# Patient Record
Sex: Female | Born: 1980 | Race: White | Hispanic: No | Smoking: Former smoker
Health system: Southern US, Community
[De-identification: ages and names within clinical notes are randomized; demographics above are authoritative.]

## PROBLEM LIST (undated history)

## (undated) DIAGNOSIS — Z803 Family history of malignant neoplasm of breast: Secondary | ICD-10-CM

## (undated) DIAGNOSIS — F419 Anxiety disorder, unspecified: Secondary | ICD-10-CM

## (undated) DIAGNOSIS — Z9889 Other specified postprocedural states: Secondary | ICD-10-CM

## (undated) DIAGNOSIS — Z8041 Family history of malignant neoplasm of ovary: Secondary | ICD-10-CM

## (undated) DIAGNOSIS — C801 Malignant (primary) neoplasm, unspecified: Secondary | ICD-10-CM

## (undated) DIAGNOSIS — T8859XA Other complications of anesthesia, initial encounter: Secondary | ICD-10-CM

## (undated) DIAGNOSIS — R112 Nausea with vomiting, unspecified: Secondary | ICD-10-CM

## (undated) DIAGNOSIS — S0300XA Dislocation of jaw, unspecified side, initial encounter: Secondary | ICD-10-CM

## (undated) DIAGNOSIS — K259 Gastric ulcer, unspecified as acute or chronic, without hemorrhage or perforation: Secondary | ICD-10-CM

## (undated) HISTORY — PX: FINGER SURGERY: SHX640

## (undated) HISTORY — PX: OTHER SURGICAL HISTORY: SHX169

## (undated) HISTORY — DX: Family history of malignant neoplasm of ovary: Z80.41

## (undated) HISTORY — DX: Dislocation of jaw, unspecified side, initial encounter: S03.00XA

## (undated) HISTORY — PX: FRACTURE SURGERY: SHX138

## (undated) HISTORY — PX: DILATION AND CURETTAGE OF UTERUS: SHX78

## (undated) HISTORY — DX: Family history of malignant neoplasm of breast: Z80.3

---

## 1999-04-27 ENCOUNTER — Ambulatory Visit (HOSPITAL_COMMUNITY): Admission: RE | Admit: 1999-04-27 | Discharge: 1999-04-27 | Payer: Self-pay | Admitting: Obstetrics & Gynecology

## 1999-04-27 ENCOUNTER — Encounter (INDEPENDENT_AMBULATORY_CARE_PROVIDER_SITE_OTHER): Payer: Self-pay

## 1999-08-03 ENCOUNTER — Ambulatory Visit (HOSPITAL_COMMUNITY): Admission: RE | Admit: 1999-08-03 | Discharge: 1999-08-03 | Payer: Self-pay | Admitting: Gastroenterology

## 1999-08-31 ENCOUNTER — Encounter: Admission: RE | Admit: 1999-08-31 | Discharge: 1999-08-31 | Payer: Self-pay | Admitting: Internal Medicine

## 1999-08-31 ENCOUNTER — Encounter: Payer: Self-pay | Admitting: Internal Medicine

## 1999-10-12 ENCOUNTER — Encounter: Admission: RE | Admit: 1999-10-12 | Discharge: 1999-11-30 | Payer: Self-pay | Admitting: Internal Medicine

## 2000-01-16 ENCOUNTER — Other Ambulatory Visit: Admission: RE | Admit: 2000-01-16 | Discharge: 2000-01-16 | Payer: Self-pay | Admitting: Obstetrics & Gynecology

## 2000-02-02 ENCOUNTER — Encounter: Payer: Self-pay | Admitting: Obstetrics & Gynecology

## 2000-02-02 ENCOUNTER — Ambulatory Visit (HOSPITAL_COMMUNITY): Admission: AD | Admit: 2000-02-02 | Discharge: 2000-02-02 | Payer: Self-pay | Admitting: Obstetrics and Gynecology

## 2000-03-04 ENCOUNTER — Encounter: Payer: Self-pay | Admitting: Obstetrics and Gynecology

## 2000-03-04 ENCOUNTER — Ambulatory Visit (HOSPITAL_COMMUNITY): Admission: RE | Admit: 2000-03-04 | Discharge: 2000-03-04 | Payer: Self-pay

## 2000-08-05 ENCOUNTER — Inpatient Hospital Stay (HOSPITAL_COMMUNITY): Admission: AD | Admit: 2000-08-05 | Discharge: 2000-08-05 | Payer: Self-pay | Admitting: Obstetrics & Gynecology

## 2000-08-06 ENCOUNTER — Inpatient Hospital Stay (HOSPITAL_COMMUNITY): Admission: AD | Admit: 2000-08-06 | Discharge: 2000-08-09 | Payer: Self-pay | Admitting: Obstetrics & Gynecology

## 2000-08-10 ENCOUNTER — Encounter: Admission: RE | Admit: 2000-08-10 | Discharge: 2000-09-25 | Payer: Self-pay | Admitting: Obstetrics & Gynecology

## 2001-12-16 ENCOUNTER — Other Ambulatory Visit: Admission: RE | Admit: 2001-12-16 | Discharge: 2001-12-16 | Payer: Self-pay | Admitting: Obstetrics and Gynecology

## 2002-03-26 ENCOUNTER — Inpatient Hospital Stay (HOSPITAL_COMMUNITY): Admission: AD | Admit: 2002-03-26 | Discharge: 2002-03-26 | Payer: Self-pay | Admitting: Obstetrics and Gynecology

## 2002-03-26 ENCOUNTER — Encounter: Payer: Self-pay | Admitting: Obstetrics and Gynecology

## 2002-05-28 ENCOUNTER — Inpatient Hospital Stay (HOSPITAL_COMMUNITY): Admission: AD | Admit: 2002-05-28 | Discharge: 2002-05-28 | Payer: Self-pay | Admitting: Obstetrics & Gynecology

## 2002-06-10 ENCOUNTER — Inpatient Hospital Stay (HOSPITAL_COMMUNITY): Admission: AD | Admit: 2002-06-10 | Discharge: 2002-06-10 | Payer: Self-pay | Admitting: Obstetrics and Gynecology

## 2002-06-12 ENCOUNTER — Inpatient Hospital Stay (HOSPITAL_COMMUNITY): Admission: AD | Admit: 2002-06-12 | Discharge: 2002-06-12 | Payer: Self-pay | Admitting: Obstetrics and Gynecology

## 2002-06-14 ENCOUNTER — Emergency Department (HOSPITAL_COMMUNITY): Admission: EM | Admit: 2002-06-14 | Discharge: 2002-06-14 | Payer: Self-pay | Admitting: Emergency Medicine

## 2002-06-24 ENCOUNTER — Ambulatory Visit (HOSPITAL_COMMUNITY): Admission: RE | Admit: 2002-06-24 | Discharge: 2002-06-24 | Payer: Self-pay

## 2002-07-10 ENCOUNTER — Encounter (HOSPITAL_COMMUNITY): Admission: RE | Admit: 2002-07-10 | Discharge: 2002-08-08 | Payer: Self-pay

## 2002-07-26 ENCOUNTER — Encounter: Payer: Self-pay | Admitting: Obstetrics and Gynecology

## 2002-07-26 ENCOUNTER — Inpatient Hospital Stay (HOSPITAL_COMMUNITY): Admission: AD | Admit: 2002-07-26 | Discharge: 2002-07-26 | Payer: Self-pay

## 2002-08-08 ENCOUNTER — Inpatient Hospital Stay (HOSPITAL_COMMUNITY): Admission: AD | Admit: 2002-08-08 | Discharge: 2002-08-10 | Payer: Self-pay

## 2002-10-08 ENCOUNTER — Emergency Department (HOSPITAL_COMMUNITY): Admission: EM | Admit: 2002-10-08 | Discharge: 2002-10-08 | Payer: Self-pay | Admitting: Emergency Medicine

## 2002-10-27 ENCOUNTER — Encounter: Admission: RE | Admit: 2002-10-27 | Discharge: 2003-01-15 | Payer: Self-pay | Admitting: Internal Medicine

## 2003-01-09 ENCOUNTER — Emergency Department (HOSPITAL_COMMUNITY): Admission: EM | Admit: 2003-01-09 | Discharge: 2003-01-09 | Payer: Self-pay | Admitting: Emergency Medicine

## 2003-04-01 ENCOUNTER — Encounter: Payer: Self-pay | Admitting: Emergency Medicine

## 2003-04-01 ENCOUNTER — Emergency Department (HOSPITAL_COMMUNITY): Admission: EM | Admit: 2003-04-01 | Discharge: 2003-04-01 | Payer: Self-pay | Admitting: Emergency Medicine

## 2003-10-04 ENCOUNTER — Encounter: Admission: RE | Admit: 2003-10-04 | Discharge: 2003-10-04 | Payer: Self-pay | Admitting: Internal Medicine

## 2003-10-06 ENCOUNTER — Encounter: Admission: RE | Admit: 2003-10-06 | Discharge: 2004-01-04 | Payer: Self-pay | Admitting: *Deleted

## 2004-05-25 ENCOUNTER — Ambulatory Visit: Payer: Self-pay | Admitting: Obstetrics & Gynecology

## 2004-07-19 ENCOUNTER — Observation Stay: Payer: Self-pay | Admitting: Unknown Physician Specialty

## 2004-07-25 ENCOUNTER — Observation Stay: Payer: Self-pay

## 2004-08-10 ENCOUNTER — Observation Stay: Payer: Self-pay

## 2004-08-25 ENCOUNTER — Inpatient Hospital Stay: Payer: Self-pay | Admitting: Obstetrics & Gynecology

## 2004-10-12 ENCOUNTER — Ambulatory Visit: Payer: Self-pay | Admitting: Obstetrics & Gynecology

## 2004-11-10 ENCOUNTER — Emergency Department: Payer: Self-pay | Admitting: Emergency Medicine

## 2005-09-24 ENCOUNTER — Ambulatory Visit: Payer: Self-pay | Admitting: Unknown Physician Specialty

## 2009-06-09 ENCOUNTER — Inpatient Hospital Stay: Payer: Self-pay | Admitting: Internal Medicine

## 2009-06-09 ENCOUNTER — Ambulatory Visit: Payer: Self-pay | Admitting: Family Medicine

## 2010-02-14 ENCOUNTER — Emergency Department (HOSPITAL_COMMUNITY): Admission: EM | Admit: 2010-02-14 | Discharge: 2010-02-14 | Payer: Self-pay | Admitting: Emergency Medicine

## 2010-08-25 ENCOUNTER — Emergency Department: Payer: Self-pay | Admitting: Internal Medicine

## 2012-01-16 ENCOUNTER — Emergency Department (HOSPITAL_COMMUNITY)
Admission: EM | Admit: 2012-01-16 | Discharge: 2012-01-16 | Disposition: A | Discharge status: Home / Self Care | Payer: Worker's Compensation | Attending: Emergency Medicine | Admitting: Emergency Medicine

## 2012-01-16 DIAGNOSIS — Y9289 Other specified places as the place of occurrence of the external cause: Secondary | ICD-10-CM | POA: Insufficient documentation

## 2012-01-16 DIAGNOSIS — IMO0001 Reserved for inherently not codable concepts without codable children: Secondary | ICD-10-CM | POA: Insufficient documentation

## 2012-01-16 DIAGNOSIS — S6980XA Other specified injuries of unspecified wrist, hand and finger(s), initial encounter: Secondary | ICD-10-CM | POA: Insufficient documentation

## 2012-01-16 DIAGNOSIS — S6990XA Unspecified injury of unspecified wrist, hand and finger(s), initial encounter: Secondary | ICD-10-CM | POA: Insufficient documentation

## 2012-01-16 DIAGNOSIS — Y9389 Activity, other specified: Secondary | ICD-10-CM | POA: Insufficient documentation

## 2012-01-16 DIAGNOSIS — Y990 Civilian activity done for income or pay: Secondary | ICD-10-CM | POA: Insufficient documentation

## 2012-01-16 DIAGNOSIS — S61259A Open bite of unspecified finger without damage to nail, initial encounter: Secondary | ICD-10-CM

## 2012-01-16 MED ORDER — SODIUM CHLORIDE 0.9 % IV SOLN
3.0000 g | Freq: Once | INTRAVENOUS | Status: AC
  Administered 2012-01-16: 3 g via INTRAVENOUS
  Filled 2012-01-16: qty 3

## 2012-01-16 MED ORDER — MORPHINE SULFATE 4 MG/ML IJ SOLN
4.0000 mg | Freq: Once | INTRAMUSCULAR | Status: AC
  Administered 2012-01-16: 4 mg via INTRAVENOUS
  Filled 2012-01-16: qty 1

## 2012-01-16 MED ORDER — ONDANSETRON HCL 4 MG/2ML IJ SOLN
4.0000 mg | Freq: Once | INTRAMUSCULAR | Status: AC
  Administered 2012-01-16: 4 mg via INTRAVENOUS
  Filled 2012-01-16: qty 2

## 2012-01-16 MED ORDER — AMOXICILLIN-POT CLAVULANATE 875-125 MG PO TABS: 1.0000 | ORAL_TABLET | Freq: Two times a day (BID) | ORAL | Status: AC

## 2012-01-16 MED ORDER — DIPHENHYDRAMINE HCL 50 MG/ML IJ SOLN
25.0000 mg | Freq: Once | INTRAMUSCULAR | Status: AC
  Administered 2012-01-16: 25 mg via INTRAVENOUS
  Filled 2012-01-16: qty 1

## 2012-01-16 NOTE — ED Provider Notes (Signed)
History     CSN: [id number]  Arrival date & time 01/16/12  1933   First MD Initiated Contact with Patient 01/16/12 1938      No chief complaint on file.   (Consider location/radiation/quality/duration/timing/severity/associated sxs/prior treatment) HPI  31 year old female presents for evaluation of a cat bite. Patient states she works at an [employer]. While moving the cat from one crater to the next, the cat bit her on the left index finger multiple times. This happens 3 days ago.  Cat has been quarantine due to rabies immunization not UTD. Wound were initially evaluated at an urgent care, and patient has been seen by orthopedic Dr. Isaias Cowman recommended for her to come to the ER for further evaluation.  The patient states she continues to endorse significant pain to the left index finger. She noticed swelling, and pain radiates down to her forearm. Pain is described as aching throbbing worsening with palpation or with movement. She denies numbness. She denies fever, or chills. She has been taking pain medication including Percocet without adequate relief. She was seen here in the ED by Dr. Melvyn Novas, who has evaluated her and request IV abx with Unasyn 3g IV and dc with Augmentin 2 dose.  Pt does have hx of PCN allergy, with hives.  Dr. Melvyn Novas is aware.   No past medical history on file.  No past surgical history on file.  No family history on file.  History  Substance Use Topics  . Smoking status: Not on file  . Smokeless tobacco: Not on file  . Alcohol Use: Not on file    OB History    No data available      Review of Systems  Constitutional: Negative for fever.  Musculoskeletal: Positive for joint swelling.  Skin: Positive for wound. Negative for rash.    Allergies  Review of patient's allergies indicates not on file.  Home Medications  No current outpatient prescriptions on file.  There were no vitals taken for this visit.  Physical Exam  Nursing  note and vitals reviewed. Constitutional: She is oriented to person, place, and time. She appears well-developed and well-nourished. No distress.  HENT:  Head: Atraumatic.  Eyes: Conjunctivae are normal.  Neck: Neck supple.  Musculoskeletal:       L hand: left index finger with mult puncture wounds with scabs overlying DIP joint.  Moderate swelling throughout finger.  No erythema, sensation intact to light touch.  Decreased ROM at each joint due to pain.  No deformity noted.    Neurological: She is alert and oriented to person, place, and time.  Skin: Skin is warm. No rash noted.    ED Course  Procedures (including critical care time)  Labs Reviewed - No data to display No results found.   No diagnosis found.    MDM  L index finger injury due to cat bite.  Dr. Melvyn Novas has seen and evaluate the patient and recommend IV antibiotic with Unasyn in the ED. The patient has no signs of allergic reaction, and patient is to be discharged with Augmentin and follow up in his office. Patient voiced understanding and agrees with plan.   9:29 PM Pt currently receiving Unasyn.  She express a burning tingling sensation through the IV radiates up to arm.  She does have hx of PCN allergy.  Will give benadryl.  If sxs not improving, will consider d/c Unasyn.     9:57 PM Patient report she is feeling better. She has finished the IV  antibiotic. We'll discharge with 2 dose of Augmentin prescription and she will followup with Dr. Melvyn Novas as recommended.  Fayrene Helper, PA-C 01/16/12 2157

## 2012-01-16 NOTE — ED Notes (Signed)
Complaining of burn in IV and chest. States she is allergic to ampicillin portion of Unasyn. [facility] notified. Will continue to monitor patient

## 2012-01-16 NOTE — Discharge Instructions (Signed)
Please take antibiotic as prescribed.  Follow up with Dr. Melvyn Novas in office for further evaluation.   Animal Bite An animal bite can result in a scratch on the skin, deep open cut, puncture of the skin, crush injury, or tearing away of the skin or a body part. Dogs are responsible for most animal bites. Children are bitten more often than adults. An animal bite can range from very mild to more serious. A small bite from your house pet is no cause for alarm. However, some animal bites can become infected or injure a bone or other tissue. You must seek medical care if:  The skin is broken and bleeding does not slow down or stop after 15 minutes.   The puncture is deep and difficult to clean (such as a cat bite).   Pain, warmth, redness, or pus develops around the wound.   The bite is from a stray animal or rodent. There may be a risk of rabies infection.   The bite is from a snake, raccoon, skunk, fox, coyote, or bat. There may be a risk of rabies infection.   The person bitten has a chronic illness such as diabetes, liver disease, or cancer, or the person takes medicine that lowers the immune system.   There is concern about the location and severity of the bite.  It is important to clean and protect an animal bite wound right away to prevent infection. Follow these steps:  Clean the wound with plenty of water and soap.   Apply an antibiotic cream.   Apply gentle pressure over the wound with a clean towel or gauze to slow or stop bleeding.   Elevate the affected area above the heart to help stop any bleeding.   Seek medical care. Getting medical care within 8 hours of the animal bite leads to the best possible outcome.  DIAGNOSIS  Your caregiver will most likely:  Take a detailed history of the animal and the bite injury.   Perform a wound exam.   Take your medical history.  Blood tests or X-rays may be performed. Sometimes, infected bite wounds are cultured and sent to a lab to  identify the infectious bacteria.  TREATMENT  Medical treatment will depend on the location and type of animal bite as well as the patient's medical history. Treatment may include:  Wound care, such as cleaning and flushing the wound with saline solution, bandaging, and elevating the affected area.   Antibiotics.   Tetanus immunization.   Rabies immunization.   Leaving the wound open to heal. This is often done with animal bites, due to the high risk of infection. However, in certain cases, wound closure with stitches, wound adhesive, skin adhesive strips, or staples may be used.  Infected bites that are left untreated may require intravenous (IV) antibiotics and surgical treatment in the hospital. HOME CARE INSTRUCTIONS  Follow your caregiver's instructions for wound care.   Take all medicines as directed.   If your caregiver prescribes antibiotics, take them as directed. Finish them even if you start to feel better.   Follow up with your caregiver for further exams or immunizations as directed.  You may need a tetanus shot if:  You cannot remember when you had your last tetanus shot.   You have never had a tetanus shot.   The injury broke your skin.  If you get a tetanus shot, your arm may swell, get red, and feel warm to the touch. This is common and not  a problem. If you need a tetanus shot and you choose not to have one, there is a rare chance of getting tetanus. Sickness from tetanus can be serious. SEEK MEDICAL CARE IF:  You notice warmth, redness, soreness, swelling, pus discharge, or a bad smell coming from the wound.   You have a red line on the skin coming from the wound.   You have a fever, chills, or a general ill feeling.   You have nausea or vomiting.   You have continued or worsening pain.   You have trouble moving the injured part.   You have other questions or concerns.  MAKE SURE YOU:  Understand these instructions.   Will watch your condition.     Will get help right away if you are not doing well or get worse.  Document Released: 04/10/2011 Document Revised: 07/12/2011 Document Reviewed: 04/10/2011 [facility] Patient Information 2012 [place], [place].

## 2012-01-16 NOTE — ED Notes (Signed)
Redness noted to chest. No other areas of redness noted. Will continue to monitor patient

## 2012-01-19 NOTE — ED Provider Notes (Signed)
Medical screening examination/treatment/procedure(s) were performed by non-physician practitioner and as supervising physician I was immediately available for consultation/collaboration.    Emmanuelle Coxe L Dresean Beckel, MD 01/19/12 1533 

## 2012-01-23 ENCOUNTER — Encounter (HOSPITAL_COMMUNITY): Admission: EM | Disposition: A | Discharge status: Home / Self Care | Payer: Self-pay | Source: Home / Self Care

## 2012-01-23 ENCOUNTER — Encounter (HOSPITAL_COMMUNITY): Payer: Self-pay | Admitting: Emergency Medicine

## 2012-01-23 ENCOUNTER — Emergency Department (HOSPITAL_COMMUNITY): Admission: EM | Admit: 2012-01-23 | Discharge: 2012-01-23 | Discharge status: Against Medical Advice | Payer: Self-pay

## 2012-01-23 ENCOUNTER — Emergency Department (HOSPITAL_COMMUNITY)
Admission: EM | Admit: 2012-01-23 | Discharge: 2012-01-23 | Disposition: A | Discharge status: Home / Self Care | Payer: Worker's Compensation | Attending: Orthopedic Surgery | Admitting: Orthopedic Surgery

## 2012-01-23 ENCOUNTER — Encounter (HOSPITAL_COMMUNITY): Payer: Self-pay | Admitting: Anesthesiology

## 2012-01-23 ENCOUNTER — Emergency Department (HOSPITAL_COMMUNITY): Payer: Worker's Compensation | Admitting: Anesthesiology

## 2012-01-23 DIAGNOSIS — S61209A Unspecified open wound of unspecified finger without damage to nail, initial encounter: Secondary | ICD-10-CM | POA: Insufficient documentation

## 2012-01-23 DIAGNOSIS — Y990 Civilian activity done for income or pay: Secondary | ICD-10-CM | POA: Insufficient documentation

## 2012-01-23 DIAGNOSIS — Y9289 Other specified places as the place of occurrence of the external cause: Secondary | ICD-10-CM | POA: Insufficient documentation

## 2012-01-23 DIAGNOSIS — IMO0001 Reserved for inherently not codable concepts without codable children: Secondary | ICD-10-CM | POA: Insufficient documentation

## 2012-01-23 HISTORY — PX: I&D EXTREMITY: SHX5045

## 2012-01-23 LAB — DIFFERENTIAL
Eosinophils Absolute: 0.4 10*3/uL (ref 0.0–0.7)
Monocytes Absolute: 0.7 10*3/uL (ref 0.1–1.0)
Neutro Abs: 4.1 10*3/uL (ref 1.7–7.7)
Neutrophils Relative %: 56 % (ref 43–77)

## 2012-01-23 LAB — CBC
MCH: 33.3 pg (ref 26.0–34.0)
MCHC: 35.3 g/dL (ref 30.0–36.0)
Platelets: 231 10*3/uL (ref 150–400)
WBC: 7.3 10*3/uL (ref 4.0–10.5)

## 2012-01-23 LAB — BASIC METABOLIC PANEL
BUN: 10 mg/dL (ref 6–23)
Calcium: 9.4 mg/dL (ref 8.4–10.5)
Creatinine, Ser: 0.79 mg/dL (ref 0.50–1.10)
GFR calc Af Amer: >90 mL/min (ref >90)
GFR calc non Af Amer: >90 mL/min (ref >90)
Potassium: 3.6 mEq/L (ref 3.5–5.1)
Sodium: 138 mEq/L (ref 135–145)

## 2012-01-23 SURGERY — IRRIGATION AND DEBRIDEMENT EXTREMITY
Anesthesia: Monitor Anesthesia Care | Laterality: Left

## 2012-01-23 MED ORDER — SODIUM CHLORIDE 0.9 % IR SOLN
Status: DC | PRN
  Administered 2012-01-23: 500 mL

## 2012-01-23 MED ORDER — DIPHENHYDRAMINE HCL 50 MG/ML IJ SOLN
INTRAMUSCULAR | Status: AC
  Filled 2012-01-23: qty 1

## 2012-01-23 MED ORDER — DIPHENHYDRAMINE HCL 50 MG/ML IJ SOLN
25.0000 mg | Freq: Once | INTRAMUSCULAR | Status: AC
  Administered 2012-01-23: 25 mg via INTRAVENOUS

## 2012-01-23 MED ORDER — OXYCODONE-ACETAMINOPHEN 5-325 MG PO TABS: 1.0000 | ORAL_TABLET | ORAL | Status: AC | PRN

## 2012-01-23 MED ORDER — PROPOFOL 10 MG/ML IV EMUL
INTRAVENOUS | Status: DC | PRN
  Administered 2012-01-23: 30 mg via INTRAVENOUS

## 2012-01-23 MED ORDER — HYDROMORPHONE HCL PF 1 MG/ML IJ SOLN
0.2500 mg | INTRAMUSCULAR | Status: DC | PRN
  Administered 2012-01-23: 0.5 mg via INTRAVENOUS
  Administered 2012-01-23 (×2): 0.25 mg via INTRAVENOUS

## 2012-01-23 MED ORDER — LIDOCAINE HCL 1 % IJ SOLN
INTRAMUSCULAR | Status: DC | PRN
  Administered 2012-01-23: 21:00:00 via INTRAMUSCULAR

## 2012-01-23 MED ORDER — ONDANSETRON HCL 4 MG/2ML IJ SOLN: 4.0000 mg | Freq: Once | INTRAMUSCULAR | Status: DC | PRN

## 2012-01-23 MED ORDER — FENTANYL CITRATE 0.05 MG/ML IJ SOLN
INTRAMUSCULAR | Status: DC | PRN
  Administered 2012-01-23 (×2): 100 ug via INTRAVENOUS

## 2012-01-23 MED ORDER — BUPIVACAINE HCL (PF) 0.25 % IJ SOLN: INTRAMUSCULAR | Status: DC | PRN

## 2012-01-23 MED ORDER — LACTATED RINGERS IV SOLN
INTRAVENOUS | Status: DC | PRN
  Administered 2012-01-23: 21:00:00 via INTRAVENOUS

## 2012-01-23 MED ORDER — SODIUM CHLORIDE 0.9 % IV SOLN
3.0000 g | INTRAVENOUS | Status: DC
  Filled 2012-01-23: qty 3

## 2012-01-23 MED ORDER — MIDAZOLAM HCL 5 MG/5ML IJ SOLN
INTRAMUSCULAR | Status: DC | PRN
  Administered 2012-01-23: 2 mg via INTRAVENOUS

## 2012-01-23 MED ORDER — HYDROMORPHONE HCL PF 1 MG/ML IJ SOLN
INTRAMUSCULAR | Status: AC
  Filled 2012-01-23: qty 1

## 2012-01-23 MED ORDER — PROPOFOL 10 MG/ML IV EMUL
INTRAVENOUS | Status: DC | PRN
  Administered 2012-01-23: 25 ug/kg/min via INTRAVENOUS

## 2012-01-23 MED ORDER — SODIUM CHLORIDE 0.9 % IV SOLN
3.0000 g | INTRAVENOUS | Status: DC | PRN
  Administered 2012-01-23: 3 g via INTRAVENOUS

## 2012-01-23 SURGICAL SUPPLY — 52 items
BANDAGE CONFORM 2  STR LF (GAUZE/BANDAGES/DRESSINGS) ×2 IMPLANT
BANDAGE ELASTIC 3 VELCRO ST LF (GAUZE/BANDAGES/DRESSINGS) IMPLANT
BANDAGE ELASTIC 4 VELCRO ST LF (GAUZE/BANDAGES/DRESSINGS) IMPLANT
BANDAGE GAUZE ELAST BULKY 4 IN (GAUZE/BANDAGES/DRESSINGS) IMPLANT
BNDG COHESIVE 1X5 TAN STRL LF (GAUZE/BANDAGES/DRESSINGS) ×2 IMPLANT
BNDG ESMARK 4X9 LF (GAUZE/BANDAGES/DRESSINGS) ×2 IMPLANT
CLOTH BEACON ORANGE TIMEOUT ST (SAFETY) ×2 IMPLANT
CORDS BIPOLAR (ELECTRODE) IMPLANT
COVER SURGICAL LIGHT HANDLE (MISCELLANEOUS) ×2 IMPLANT
CUFF TOURNIQUET SINGLE 18IN (TOURNIQUET CUFF) ×2 IMPLANT
CUFF TOURNIQUET SINGLE 24IN (TOURNIQUET CUFF) IMPLANT
DRAIN PENROSE 1/4X12 LTX STRL (WOUND CARE) IMPLANT
DRAPE SURG 17X23 STRL (DRAPES) ×2 IMPLANT
DRSG ADAPTIC 3X8 NADH LF (GAUZE/BANDAGES/DRESSINGS) IMPLANT
DRSG EMULSION OIL 3X3 NADH (GAUZE/BANDAGES/DRESSINGS) ×2 IMPLANT
ELECT REM PT RETURN 9FT ADLT (ELECTROSURGICAL)
ELECTRODE REM PT RTRN 9FT ADLT (ELECTROSURGICAL) IMPLANT
GAUZE XEROFORM 1X8 LF (GAUZE/BANDAGES/DRESSINGS) IMPLANT
GAUZE XEROFORM 5X9 LF (GAUZE/BANDAGES/DRESSINGS) IMPLANT
GLOVE BIOGEL PI IND STRL 8.5 (GLOVE) ×1 IMPLANT
GLOVE BIOGEL PI INDICATOR 8.5 (GLOVE) ×1
GLOVE SURG ORTHO 8.0 STRL STRW (GLOVE) ×2 IMPLANT
GOWN PREVENTION PLUS XLARGE (GOWN DISPOSABLE) IMPLANT
GOWN STRL NON-REIN LRG LVL3 (GOWN DISPOSABLE) ×4 IMPLANT
HANDPIECE INTERPULSE COAX TIP (DISPOSABLE)
KIT BASIN OR (CUSTOM PROCEDURE TRAY) ×2 IMPLANT
KIT ROOM TURNOVER OR (KITS) ×2 IMPLANT
MANIFOLD NEPTUNE II (INSTRUMENTS) ×2 IMPLANT
NEEDLE HYPO 25GX1X1/2 BEV (NEEDLE) ×2 IMPLANT
NS IRRIG 1000ML POUR BTL (IV SOLUTION) ×2 IMPLANT
PACK ORTHO EXTREMITY (CUSTOM PROCEDURE TRAY) ×2 IMPLANT
PAD ARMBOARD 7.5X6 YLW CONV (MISCELLANEOUS) ×2 IMPLANT
PAD CAST 4YDX4 CTTN HI CHSV (CAST SUPPLIES) IMPLANT
PADDING CAST COTTON 4X4 STRL (CAST SUPPLIES)
SET HNDPC FAN SPRY TIP SCT (DISPOSABLE) IMPLANT
SOAP 2 % CHG 4 OZ (WOUND CARE) ×2 IMPLANT
SPONGE GAUZE 4X4 12PLY (GAUZE/BANDAGES/DRESSINGS) IMPLANT
SPONGE LAP 18X18 X RAY DECT (DISPOSABLE) IMPLANT
SPONGE LAP 4X18 X RAY DECT (DISPOSABLE) IMPLANT
SUCTION FRAZIER TIP 10 FR DISP (SUCTIONS) ×2 IMPLANT
SUT ETHILON 4 0 PS 2 18 (SUTURE) IMPLANT
SUT ETHILON 5 0 P 3 18 (SUTURE)
SUT NYLON ETHILON 5-0 P-3 1X18 (SUTURE) IMPLANT
SUT PROLENE 5 0 PS 2 (SUTURE) ×2 IMPLANT
SYR CONTROL 10ML LL (SYRINGE) ×2 IMPLANT
TOWEL OR 17X24 6PK STRL BLUE (TOWEL DISPOSABLE) ×2 IMPLANT
TOWEL OR 17X26 10 PK STRL BLUE (TOWEL DISPOSABLE) ×2 IMPLANT
TUBE ANAEROBIC SPECIMEN COL (MISCELLANEOUS) IMPLANT
TUBE CONNECTING 12X1/4 (SUCTIONS) IMPLANT
UNDERPAD 30X30 INCONTINENT (UNDERPADS AND DIAPERS) ×2 IMPLANT
WATER STERILE IRR 1000ML POUR (IV SOLUTION) IMPLANT
YANKAUER SUCT BULB TIP NO VENT (SUCTIONS) ×2 IMPLANT

## 2012-01-23 NOTE — H&P (Signed)
Brianna Phillips is an 31 y.o. female.   Chief Complaint: LEFT INDEX FINGER CAT BITE HPI: PT SUSTAINED INJURY AT WORK FROM CAT BITE BEEN FOLLOWED IN OFFICE AND ON ORAL ANTIBIOTICS PT WITH WORSENING PAIN AND SWELLING TODAY PT SEEN AND EVALUATED IN ED TONIGHT CONCERN FOR WORSENING INFECTION PT RECOMMENDED TO UNDERGO FORMAL I/D IN OR FOR PERSISTENT INFECTION  History reviewed. No pertinent past medical history.  History reviewed. No pertinent past surgical history.  No family history on file. Social History:  reports that she has been smoking.  She does not have any smokeless tobacco history on file. She reports that she drinks alcohol. She reports that she does not use illicit drugs.  Allergies:  Allergies  Allergen Reactions  . Penicillins      (Not in a hospital admission)  Results for orders placed during the hospital encounter of 01/23/12 (from the past 48 hour(s))  CBC     Status: Normal   Collection Time   01/23/12  7:42 PM      Component Value Range Comment   WBC 7.3  4.0 - 10.5 K/uL    RBC 4.50  3.87 - 5.11 MIL/uL    Hemoglobin 15.0  12.0 - 15.0 g/dL    HCT 14.7  82.9 - 56.2 %    MCV 94.4  78.0 - 100.0 fL    MCH 33.3  26.0 - 34.0 pg    MCHC 35.3  30.0 - 36.0 g/dL    RDW 13.0  86.5 - 78.4 %    Platelets 231  150 - 400 K/uL   DIFFERENTIAL     Status: Normal   Collection Time   01/23/12  7:42 PM      Component Value Range Comment   Neutrophils Relative 56  43 - 77 %    Neutro Abs 4.1  1.7 - 7.7 K/uL    Lymphocytes Relative 29  12 - 46 %    Lymphs Abs 2.1  0.7 - 4.0 K/uL    Monocytes Relative 9  3 - 12 %    Monocytes Absolute 0.7  0.1 - 1.0 K/uL    Eosinophils Relative 5  0 - 5 %    Eosinophils Absolute 0.4  0.0 - 0.7 K/uL    Basophils Relative 0  0 - 1 %    Basophils Absolute 0.0  0.0 - 0.1 K/uL    No results found.  NO RECENT ILLNESSES OR HOSPITALIZATIONS  Blood pressure 121/74, pulse 84, temperature 98.2 F (36.8 C), temperature source Oral, resp. rate 18,  SpO2 100.00%. General Appearance:  Alert, cooperative, no distress, appears stated age  Head:  Normocephalic, without obvious abnormality, atraumatic  Eyes:  Pupils equal, conjunctiva/corneas clear,         Throat: Lips, mucosa, and tongue normal; teeth and gums normal  Neck: No visible masses     Lungs:   respirations unlabored  Chest Wall:  No tenderness or deformity  Heart:  Regular rate and rhythm,  Abdomen:   Soft, non-tender,         Extremities: LEFT INDEX FINGER: MODERATE SWELLING OVER DORSUM OF INDEX FINGER. MILD AMOUNT OF SURROUNDING ERYTHEMA DISTALLY FINGERS WARM WELL PERFUSED GOOD MOVEMENT OF DIGITS. NO WOUNDS TO LONG RING/SMALL/THUMB  Pulses: 2+ and symmetric  Skin: Skin color, texture, turgor normal, no rashes or lesions     Neurologic: Normal    Assessment/Plan  LEFT INDEX FINGER CAT BITE WITH DEEP SPACE INFECTION  LEFT INDEX FINGER INCISION AND DRAINAGE AND  JOINT ARTHROTOMY  R/B/A DISCUSSED WITH PT IN ED.  PT VOICED UNDERSTANDING OF PLAN CONSENT SIGNED DAY OF SURGERY PT SEEN AND EXAMINED PRIOR TO OPERATIVE PROCEDURE/DAY OF SURGERY SITE MARKED. QUESTIONS ANSWERED WILL GO HOME FOLLOWING SURGERY  Sharma Covert 01/23/2012, 8:12 PM

## 2012-01-23 NOTE — Op Note (Signed)
[name], DEUTSCHMAN NO.:  [id number]  MEDICAL RECORD NO.:  [id number]  LOCATION:  [facility]                         FACILITY:  [facility]  PHYSICIAN:  Madelynn Done, MD  DATE OF BIRTH:  12-Aug-1980  DATE OF PROCEDURE:  01/23/2012 DATE OF DISCHARGE:                              OPERATIVE REPORT   PREOPERATIVE DIAGNOSIS:  Left index finger abscess with joint involvement.  POSTOPERATIVE DIAGNOSIS:  Left index finger abscess with joint involvement.  ATTENDING PHYSICIAN:  Madelynn Done, MD who scrubbed and present for the entire procedure.  ASSISTANT SURGEON:  None.  SURGICAL PROCEDURES: 1. Left index finger distal interphalangeal joint arthrotomy,     exploration and drainage. 2. Left index finger debridement of skin, subcutaneous tissue, and     extensor tendon excisional debridement.  SURGICAL ANESTHESIA:  1% Xylocaine, 0.25% Marcaine local block with IV sedation.  SURGICAL INDICATION:  [name] is a 31 year old female who sustained multiple cat bites at work.  The patient had persistent infection to the left index finger.  It was recommended that she undergo the above procedure.  Risks, benefits, and alternatives were discussed in detail with the patient and signed informed consent was obtained.  Risks include, but not limited to bleeding; infection; damage to nearby nerves, arteries, or tendons; loss of motion of the elbow, wrist and digits; and need for further surgical intervention.  DESCRIPTION OF PROCEDURE:  The patient was properly identified in the preoperative holding area, a marked with a permanent marker made on the left index finger to indicate the correct operative site.  The patient was then brought back to the operating room, placed supine on the anesthesia room table where the IV sedation was administered.  A local anesthetic was administered.  Well-padded tourniquet was then placed on the left brachium and sealed with 1000 drape.   The left upper extremity was then prepped and draped in normal sterile fashion.  Time-out was called, correct side was identified, and procedure was then begun. Attention was then turned to the left index finger.  The transverse incision was made directly over the dorsal aspect of the index finger. Excisional debridement was then carried out of the skin, subcutaneous tissue, and extensor tendon.  The patient's joint was at the open distal interphalangeal joint.  Joint arthrotomy was then extended.  The joint was opened, and copious irrigation and debridement was then carried out sharply.  The joint was then thoroughly irrigated.  After arthrotomy and excisional debridement, the wound was then thoroughly irrigated once again and then loosely reapproximated with two 5-0 Prolene sutures. Adaptic dressing and sterile compressive bandage were then applied.  The patient tolerated the procedure well, returned to the recovery room in good condition.  POSTOPERATIVE PLAN:  The patient was discharged home, seen back in the office in approximately 2 days for wound check and back into her small protective splint.  Was needed to protect the extensor mechanism distally as the patient did have partial incompetence of the extensor mechanism, but greater than 50% was in continuity and protect the joint with the underlying infection.     Madelynn Done, MD     FWO/MEDQ  D:  01/23/2012  T:  01/23/2012  Job:  [id number]

## 2012-01-23 NOTE — Preoperative (Signed)
Beta Blockers   Reason not to administer Beta Blockers:Not Applicable 

## 2012-01-23 NOTE — Transfer of Care (Signed)
Immediate Anesthesia Transfer of Care Note  Patient: Brianna Phillips  Procedure(s) Performed: Procedure(s) (LRB): IRRIGATION AND DEBRIDEMENT EXTREMITY (Left)  Patient Location: PACU  Anesthesia Type: MAC  Level of Consciousness: awake, alert  and oriented  Airway & Oxygen Therapy: Patient Spontanous Breathing  Post-op Assessment: Report given to PACU RN  Post vital signs: Reviewed and stable  Complications: No apparent anesthesia complications

## 2012-01-23 NOTE — Progress Notes (Signed)
Pt ordered Unasyn. Pt has allergy to PCN. Pt states to have had reaction to Unasyn at time of last administration: "a brick sitting on my chest and difficulty swallowing"; pt was given Benadryl at last administration. Pt is taking PO augmentin at home with side effect of nausea/vomitting. Dr Melvyn Novas spoke with pt at bedside. Pt states IV antibiotics is easier to take than PO. MD ordered to continue with administration of Unasyn with IV benadryl. Unasyn administered by CRNA.

## 2012-01-23 NOTE — ED Notes (Signed)
Pt to ED for infection to left index finger.  Pt to go to OR per Dr. Melvyn Novas.

## 2012-01-23 NOTE — Anesthesia Postprocedure Evaluation (Signed)
  Anesthesia Post-op Note  Patient: Brianna Phillips  Procedure(s) Performed: Procedure(s) (LRB): IRRIGATION AND DEBRIDEMENT EXTREMITY (Left)  Patient Location: PACU  Anesthesia Type: MAC  Level of Consciousness: awake, alert  and oriented  Airway and Oxygen Therapy: Patient Spontanous Breathing  Post-op Pain: none  Post-op Assessment: Post-op Vital signs reviewed  Post-op Vital Signs: Reviewed  Complications: No apparent anesthesia complications

## 2012-01-23 NOTE — Discharge Instructions (Signed)
KEEP BANDAGE CLEAN AND DRY °CALL OFFICE FOR F/U APPT [phone] IN 2 DAYS °KEEP HAND ELEVATED ABOVE HEART °OK TO APPLY ICE TO OPERATIVE AREA °CONTACT OFFICE IF ANY WORSENING PAIN OR CONCERNS. °

## 2012-01-23 NOTE — Brief Op Note (Signed)
01/23/2012  9:32 PM  PATIENT:  Brianna Phillips  31 y.o. female  PRE-OPERATIVE DIAGNOSIS:  Infected Left Index Finger  POST-OPERATIVE DIAGNOSIS:  Infected Left Index Finger  PROCEDURE:  Procedure(s) (LRB): IRRIGATION AND DEBRIDEMENT EXTREMITY (Left)  SURGEON:  Surgeon(s) and Role:    * Sharma Covert, MD - Primary  PHYSICIAN ASSISTANT:   ASSISTANTS: none   ANESTHESIA:   local  EBL:  Total I/O In: -  Out: 2 [Urine:2]  BLOOD ADMINISTERED:none  DRAINS: none   LOCAL MEDICATIONS USED:  LIDOCAINE   SPECIMEN:  No Specimen  DISPOSITION OF SPECIMEN:  N/A  COUNTS:  YES  TOURNIQUET:   Total Tourniquet Time Documented: Forearm (Left) - 6 minutes  DICTATION: .Other Dictation: Dictation Number [id number]  PLAN OF CARE: Discharge to home after PACU  PATIENT DISPOSITION:  PACU - hemodynamically stable.   Delay start of Pharmacological VTE agent (>24hrs) due to surgical blood loss or risk of bleeding: na

## 2012-01-23 NOTE — Anesthesia Preprocedure Evaluation (Addendum)
Anesthesia Evaluation  Patient identified by MRN, date of birth, ID band Patient awake    Reviewed: Allergy & Precautions, H&P , NPO status , Patient's Chart, lab work & pertinent test results  Airway Mallampati: I TM Distance: >3 FB Neck ROM: Full    Dental  (+) Teeth Intact and Dental Advisory Given   Pulmonary  breath sounds clear to auscultation        Cardiovascular Rhythm:Regular Rate:Normal     Neuro/Psych    GI/Hepatic   Endo/Other    Renal/GU      Musculoskeletal   Abdominal   Peds  Hematology   Anesthesia Other Findings   Reproductive/Obstetrics                           Anesthesia Physical Anesthesia Plan  ASA: I  Anesthesia Plan: MAC   Post-op Pain Management:    Induction: Intravenous  Airway Management Planned: Simple Face Mask  Additional Equipment:   Intra-op Plan:   Post-operative Plan:   Informed Consent: I have reviewed the patients History and Physical, chart, labs and discussed the procedure including the risks, benefits and alternatives for the proposed anesthesia with the patient or authorized representative who has indicated his/her understanding and acceptance.   Dental advisory given  Plan Discussed with: CRNA, Anesthesiologist and Surgeon  Anesthesia Plan Comments:        Anesthesia Quick Evaluation  

## 2012-01-25 ENCOUNTER — Encounter (HOSPITAL_COMMUNITY): Payer: Self-pay | Admitting: Orthopedic Surgery

## 2013-03-14 ENCOUNTER — Other Ambulatory Visit: Payer: Self-pay

## 2013-03-14 ENCOUNTER — Encounter (HOSPITAL_COMMUNITY): Payer: Self-pay | Admitting: Emergency Medicine

## 2013-03-14 ENCOUNTER — Emergency Department (HOSPITAL_COMMUNITY)
Admission: EM | Admit: 2013-03-14 | Discharge: 2013-03-14 | Disposition: A | Discharge status: Home / Self Care | Payer: Self-pay | Attending: Emergency Medicine | Admitting: Emergency Medicine

## 2013-03-14 DIAGNOSIS — R110 Nausea: Secondary | ICD-10-CM | POA: Insufficient documentation

## 2013-03-14 DIAGNOSIS — Z79899 Other long term (current) drug therapy: Secondary | ICD-10-CM | POA: Insufficient documentation

## 2013-03-14 DIAGNOSIS — K279 Peptic ulcer, site unspecified, unspecified as acute or chronic, without hemorrhage or perforation: Secondary | ICD-10-CM

## 2013-03-14 DIAGNOSIS — Z87891 Personal history of nicotine dependence: Secondary | ICD-10-CM | POA: Insufficient documentation

## 2013-03-14 DIAGNOSIS — K259 Gastric ulcer, unspecified as acute or chronic, without hemorrhage or perforation: Secondary | ICD-10-CM | POA: Insufficient documentation

## 2013-03-14 DIAGNOSIS — Z880 Allergy status to penicillin: Secondary | ICD-10-CM | POA: Insufficient documentation

## 2013-03-14 HISTORY — DX: Gastric ulcer, unspecified as acute or chronic, without hemorrhage or perforation: K25.9

## 2013-03-14 LAB — COMPREHENSIVE METABOLIC PANEL
ALT: 19 U/L (ref 0–35)
AST: 23 U/L (ref 0–37)
CO2: 24 mEq/L (ref 19–32)
Chloride: 103 mEq/L (ref 96–112)
GFR calc non Af Amer: >90 mL/min (ref >90)
Sodium: 142 mEq/L (ref 135–145)
Total Bilirubin: 0.3 mg/dL (ref 0.3–1.2)

## 2013-03-14 LAB — CBC
HCT: 39.3 % (ref 36.0–46.0)
Hemoglobin: 13.5 g/dL (ref 12.0–15.0)
MCH: 32.2 pg (ref 26.0–34.0)
MCHC: 34.4 g/dL (ref 30.0–36.0)
MCV: 93.8 fL (ref 78.0–100.0)
Platelets: 260 10*3/uL (ref 150–400)
RBC: 4.19 MIL/uL (ref 3.87–5.11)
RDW: 12.7 % (ref 11.5–15.5)
WBC: 6.5 10*3/uL (ref 4.0–10.5)

## 2013-03-14 LAB — POCT I-STAT TROPONIN I: Troponin i, poc: 0 ng/mL (ref 0.00–0.08)

## 2013-03-14 LAB — LIPASE, BLOOD: Lipase: 35 U/L (ref 11–59)

## 2013-03-14 MED ORDER — SUCRALFATE 1 G PO TABS
1.0000 g | ORAL_TABLET | Freq: Once | ORAL | Status: AC
  Administered 2013-03-14: 1 g via ORAL
  Filled 2013-03-14: qty 1

## 2013-03-14 MED ORDER — GI COCKTAIL ~~LOC~~
30.0000 mL | Freq: Once | ORAL | Status: AC
  Administered 2013-03-14: 30 mL via ORAL
  Filled 2013-03-14: qty 30

## 2013-03-14 MED ORDER — PANTOPRAZOLE SODIUM 40 MG IV SOLR
40.0000 mg | Freq: Once | INTRAVENOUS | Status: AC
  Administered 2013-03-14: 40 mg via INTRAVENOUS
  Filled 2013-03-14: qty 40

## 2013-03-14 MED ORDER — SUCRALFATE 1 G PO TABS: 1.0000 g | ORAL_TABLET | Freq: Four times a day (QID) | ORAL | Status: DC

## 2013-03-14 MED ORDER — PANTOPRAZOLE SODIUM 20 MG PO TBEC: 40.0000 mg | DELAYED_RELEASE_TABLET | Freq: Every day | ORAL | Status: DC

## 2013-03-14 MED ORDER — ONDANSETRON HCL 4 MG/2ML IJ SOLN
4.0000 mg | Freq: Once | INTRAMUSCULAR | Status: AC
  Administered 2013-03-14: 4 mg via INTRAVENOUS
  Filled 2013-03-14: qty 2

## 2013-03-14 NOTE — ED Notes (Signed)
Pt c/o central chest pain radiating through to her back, sharp pressure onset 0120. Pt states pain began after eating. Pt states she does have ulcers and has been very stressed today.

## 2013-03-14 NOTE — ED Notes (Signed)
Dr. Molpus at bedside. 

## 2013-03-14 NOTE — ED Notes (Signed)
Was going to start IV and draw labs,  Pt states she is 100 % sure this  Her stomach ulcers and she is not having any type of chest pain

## 2013-03-14 NOTE — ED Notes (Signed)
Pt states that GI cocktail made pain worse,  Dr Read Drivers aware and put new order for lab work Lipase

## 2013-03-14 NOTE — ED Notes (Signed)
Pt very tearful, states it has been a stressful day, she had a couple of beers and had greasy food,  States she usually takes 150 mg Zantac daily just to keep her ulcers under control,  States in the past it has "almost ruptured"

## 2013-03-14 NOTE — ED Notes (Signed)
Pt doesn't want workup for cardiac symptoms,  Only wants to be treated for her epigastric pain

## 2013-03-14 NOTE — ED Notes (Signed)
Blood sent to lab

## 2013-03-14 NOTE — ED Provider Notes (Signed)
CSN: [id number]     Arrival date & time 03/14/13  0148 History     First MD Initiated Contact with Patient 03/14/13 0256     Chief Complaint  Patient presents with  . Abdominal Pain   (Consider location/radiation/quality/duration/timing/severity/associated sxs/prior Treatment) HPI This is a 32 year old female with a history of peptic ulcer disease. She is here with epigastric pain following a dietary indiscretion yesterday evening (fast food and 2 beers). She describes the pain as like previous episodes of peptic ulcer disease, specifically sharp and radiating to the back. It is somewhat worse with movement, breathing or palpation. She has had equivocal nausea but no vomiting or diarrhea. She is on Zantac but not a PPI. She has been on sucralfate in the past but not presently. The pain was severe at its worst but has improved.   Past Medical History  Diagnosis Date  . TMJ (dislocation of temporomandibular joint)   . Gastric ulcer    Past Surgical History  Procedure Laterality Date  . Fracture surgery      Left Wrist  . Dilation and curettage of uterus    . I&d extremity  01/23/2012    Procedure: IRRIGATION AND DEBRIDEMENT EXTREMITY;  Surgeon: Sharma Covert, MD;  Location: [facility] OR;  Service: Orthopedics;  Laterality: Left;  Left Index Finger  . Finger surgery     No family history on file. History  Substance Use Topics  . Smoking status: Former Games developer  . Smokeless tobacco: Not on file  . Alcohol Use: Yes     Comment: Ocassional   OB History   Grav Para Term Preterm Abortions TAB SAB Ect Mult Living                 Review of Systems  All other systems reviewed and are negative.    Allergies  Penicillins and Other  Home Medications   Current Outpatient Rx  Name  Route  Sig  Dispense  Refill  . OVER THE COUNTER MEDICATION   Oral   Take 1 capsule by mouth at bedtime. OTC "Detox regimen"         . ranitidine (ZANTAC) 150 MG tablet   Oral   Take 150 mg by mouth 3  (three) times daily as needed for heartburn.           BP 133/76  Pulse 103  Temp(Src) 98 F (36.7 C) (Oral)  Resp 18  Ht 5\' 2"  (1.575 m)  Wt 150 lb (68.04 kg)  BMI 27.43 kg/m2  SpO2 100%  LMP 03/06/2013  Physical Exam General: Well-developed, well-nourished female in no acute distress; appearance consistent with age of record HENT: normocephalic, atraumatic Eyes: pupils equal round and reactive to light; extraocular muscles intact Neck: supple Heart: regular rate and rhythm Lungs: clear to auscultation bilaterally Abdomen: soft; nondistended; epigastric tenderness; no masses or hepatosplenomegaly; bowel sounds present Extremities: No deformity; full range of motion; pulses normal Neurologic: Awake, alert and oriented; motor function intact in all extremities and symmetric; no facial droop Skin: Warm and dry Psychiatric: Normal mood and affect    ED Course   Procedures (including critical care time)    MDM   Nursing notes and vitals signs, including pulse oximetry, reviewed.  Summary of this visit's results, reviewed by myself:  Labs:  Results for orders placed during the hospital encounter of 03/14/13 (from the past 24 hour(s))  COMPREHENSIVE METABOLIC PANEL     Status: Abnormal   Collection Time    03/14/13  2:52 AM      Result Value Range   Sodium 142  135 - 145 mEq/L   Potassium 3.3 (*) 3.5 - 5.1 mEq/L   Chloride 103  96 - 112 mEq/L   CO2 24  19 - 32 mEq/L   Glucose, Bld 112 (*) 70 - 99 mg/dL   BUN 7  6 - 23 mg/dL   Creatinine, Ser 4.54  0.50 - 1.10 mg/dL   Calcium 9.1  8.4 - 09.8 mg/dL   Total Protein 7.1  6.0 - 8.3 g/dL   Albumin 3.5  3.5 - 5.2 g/dL   AST 23  0 - 37 U/L   ALT 19  0 - 35 U/L   Alkaline Phosphatase 68  39 - 117 U/L   Total Bilirubin 0.3  0.3 - 1.2 mg/dL   GFR calc non Af Amer >90  >90 mL/min   GFR calc Af Amer >90  >90 mL/min  CBC     Status: None   Collection Time    03/14/13  2:52 AM      Result Value Range   WBC 6.5  4.0 -  10.5 K/uL   RBC 4.19  3.87 - 5.11 MIL/uL   Hemoglobin 13.5  12.0 - 15.0 g/dL   HCT 11.9  14.7 - 82.9 %   MCV 93.8  78.0 - 100.0 fL   MCH 32.2  26.0 - 34.0 pg   MCHC 34.4  30.0 - 36.0 g/dL   RDW 56.2  13.0 - 86.5 %   Platelets 260  150 - 400 K/uL  LIPASE, BLOOD     Status: None   Collection Time    03/14/13  2:52 AM      Result Value Range   Lipase 35  11 - 59 U/L  POCT I-STAT TROPONIN I     Status: None   Collection Time    03/14/13  3:03 AM      Result Value Range   Troponin i, poc 0.00  0.00 - 0.08 ng/mL   Comment 3              EKG Interpretation:  Date & Time: 03/14/2013 1:58 AM  Rate: 105  Rhythm: sinus tachycardia  QRS Axis: normal  Intervals: normal  ST/T Wave abnormalities: nonspecific T wave changes  Conduction Disutrbances:none  Narrative Interpretation:   Old EKG Reviewed: unchanged  5:25 AM No significant relief with GI cocktail. Patient is still having epigastric discomfort, worse with movement. We will start her on a PPI and Carafate and refer her to GI.     Hanley Seamen, MD 03/14/13 [phone]

## 2014-12-23 ENCOUNTER — Other Ambulatory Visit: Payer: Self-pay | Admitting: Nurse Practitioner

## 2014-12-23 DIAGNOSIS — Z1231 Encounter for screening mammogram for malignant neoplasm of breast: Secondary | ICD-10-CM

## 2014-12-24 ENCOUNTER — Ambulatory Visit
Admission: RE | Admit: 2014-12-24 | Discharge: 2014-12-24 | Disposition: A | Discharge status: Home / Self Care | Payer: Self-pay | Source: Ambulatory Visit | Attending: Nurse Practitioner | Admitting: Nurse Practitioner

## 2014-12-24 DIAGNOSIS — Z1231 Encounter for screening mammogram for malignant neoplasm of breast: Secondary | ICD-10-CM

## 2014-12-28 ENCOUNTER — Other Ambulatory Visit: Payer: Self-pay | Admitting: Nurse Practitioner

## 2014-12-28 DIAGNOSIS — R928 Other abnormal and inconclusive findings on diagnostic imaging of breast: Secondary | ICD-10-CM

## 2014-12-28 DIAGNOSIS — N6489 Other specified disorders of breast: Secondary | ICD-10-CM

## 2014-12-29 ENCOUNTER — Other Ambulatory Visit: Payer: Self-pay | Admitting: Nurse Practitioner

## 2014-12-29 ENCOUNTER — Ambulatory Visit: Payer: Self-pay

## 2014-12-29 ENCOUNTER — Ambulatory Visit
Admission: RE | Admit: 2014-12-29 | Discharge: 2014-12-29 | Disposition: A | Discharge status: Home / Self Care | Payer: Self-pay | Source: Ambulatory Visit | Attending: Nurse Practitioner | Admitting: Nurse Practitioner

## 2014-12-29 DIAGNOSIS — R928 Other abnormal and inconclusive findings on diagnostic imaging of breast: Secondary | ICD-10-CM

## 2014-12-29 DIAGNOSIS — N6489 Other specified disorders of breast: Secondary | ICD-10-CM

## 2015-01-12 ENCOUNTER — Other Ambulatory Visit: Payer: Self-pay | Admitting: Nurse Practitioner

## 2015-01-12 DIAGNOSIS — N63 Unspecified lump in unspecified breast: Secondary | ICD-10-CM

## 2015-01-12 DIAGNOSIS — N6001 Solitary cyst of right breast: Secondary | ICD-10-CM

## 2015-01-14 ENCOUNTER — Other Ambulatory Visit: Payer: Self-pay | Admitting: Nurse Practitioner

## 2015-01-14 ENCOUNTER — Ambulatory Visit
Admission: RE | Admit: 2015-01-14 | Discharge: 2015-01-14 | Disposition: A | Discharge status: Home / Self Care | Payer: Self-pay | Source: Ambulatory Visit | Attending: Nurse Practitioner | Admitting: Nurse Practitioner

## 2015-01-14 DIAGNOSIS — N6001 Solitary cyst of right breast: Secondary | ICD-10-CM

## 2015-01-14 DIAGNOSIS — N63 Unspecified lump in unspecified breast: Secondary | ICD-10-CM

## 2015-01-14 NOTE — Progress Notes (Signed)
Patient had a [facility] mammogram with [facility] 3 results.  She was scheduled for a biopsy in the [facility] today.  Arrived to enroll patient into [facility], and had patient sign consent prior to biopsy.  FNA performed with specimen sent to pathology, so [facility] paperwork not completed.  Instructed patient to call me if 6 month follow-up mammogram recommended by radiologist, or if she has any breast concerns , and as long as she qualifies , we will enroll her in [facility], or one of our assistance programs for future mammograms.

## 2015-07-11 ENCOUNTER — Emergency Department: Payer: No Typology Code available for payment source

## 2015-07-11 ENCOUNTER — Emergency Department
Admission: EM | Admit: 2015-07-11 | Discharge: 2015-07-11 | Disposition: A | Discharge status: Home / Self Care | Payer: No Typology Code available for payment source | Attending: Emergency Medicine | Admitting: Emergency Medicine

## 2015-07-11 DIAGNOSIS — Z79899 Other long term (current) drug therapy: Secondary | ICD-10-CM | POA: Diagnosis not present

## 2015-07-11 DIAGNOSIS — S46912A Strain of unspecified muscle, fascia and tendon at shoulder and upper arm level, left arm, initial encounter: Secondary | ICD-10-CM | POA: Diagnosis not present

## 2015-07-11 DIAGNOSIS — Y92410 Unspecified street and highway as the place of occurrence of the external cause: Secondary | ICD-10-CM | POA: Insufficient documentation

## 2015-07-11 DIAGNOSIS — Z87891 Personal history of nicotine dependence: Secondary | ICD-10-CM | POA: Insufficient documentation

## 2015-07-11 DIAGNOSIS — Z880 Allergy status to penicillin: Secondary | ICD-10-CM | POA: Diagnosis not present

## 2015-07-11 DIAGNOSIS — S4992XA Unspecified injury of left shoulder and upper arm, initial encounter: Secondary | ICD-10-CM | POA: Diagnosis present

## 2015-07-11 DIAGNOSIS — Y9389 Activity, other specified: Secondary | ICD-10-CM | POA: Diagnosis not present

## 2015-07-11 DIAGNOSIS — Y998 Other external cause status: Secondary | ICD-10-CM | POA: Insufficient documentation

## 2015-07-11 MED ORDER — IBUPROFEN 800 MG PO TABS: 800.0000 mg | ORAL_TABLET | Freq: Three times a day (TID) | ORAL | Status: DC | PRN

## 2015-07-11 MED ORDER — CYCLOBENZAPRINE HCL 5 MG PO TABS: 5.0000 mg | ORAL_TABLET | Freq: Three times a day (TID) | ORAL | Status: DC | PRN

## 2015-07-11 MED ORDER — KETOROLAC TROMETHAMINE 60 MG/2ML IM SOLN
60.0000 mg | Freq: Once | INTRAMUSCULAR | Status: AC
  Administered 2015-07-11: 60 mg via INTRAMUSCULAR
  Filled 2015-07-11: qty 2
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
## 2015-07-11 NOTE — ED Notes (Signed)
Pt to xray

## 2015-07-11 NOTE — ED Notes (Signed)
Pt discharged home after verbalizing understanding of discharge instructions; nad noted. Pt states she knows she has higher blood pressure during times of stress. States she doesn't take bp meds.
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
## 2019-01-13 ENCOUNTER — Ambulatory Visit: Payer: BC Managed Care – PPO | Admitting: Family Medicine

## 2019-01-13 ENCOUNTER — Other Ambulatory Visit: Payer: Self-pay

## 2019-01-13 ENCOUNTER — Other Ambulatory Visit (HOSPITAL_COMMUNITY)
Admission: RE | Admit: 2019-01-13 | Discharge: 2019-01-13 | Disposition: A | Discharge status: Home / Self Care | Payer: BC Managed Care – PPO | Source: Ambulatory Visit | Attending: Family Medicine | Admitting: Family Medicine

## 2019-01-13 ENCOUNTER — Encounter: Payer: Self-pay | Admitting: Family Medicine

## 2019-01-13 VITALS — BP 124/72 | HR 97 | Temp 98.6°F | Resp 16 | Ht 62.0 in | Wt 125.6 lb

## 2019-01-13 DIAGNOSIS — Z803 Family history of malignant neoplasm of breast: Secondary | ICD-10-CM | POA: Insufficient documentation

## 2019-01-13 DIAGNOSIS — Z7689 Persons encountering health services in other specified circumstances: Secondary | ICD-10-CM | POA: Diagnosis not present

## 2019-01-13 DIAGNOSIS — R5383 Other fatigue: Secondary | ICD-10-CM

## 2019-01-13 DIAGNOSIS — K259 Gastric ulcer, unspecified as acute or chronic, without hemorrhage or perforation: Secondary | ICD-10-CM | POA: Insufficient documentation

## 2019-01-13 DIAGNOSIS — J310 Chronic rhinitis: Secondary | ICD-10-CM | POA: Diagnosis not present

## 2019-01-13 DIAGNOSIS — Z1239 Encounter for other screening for malignant neoplasm of breast: Secondary | ICD-10-CM

## 2019-01-13 DIAGNOSIS — N946 Dysmenorrhea, unspecified: Secondary | ICD-10-CM

## 2019-01-13 DIAGNOSIS — Z01419 Encounter for gynecological examination (general) (routine) without abnormal findings: Secondary | ICD-10-CM | POA: Diagnosis present

## 2019-01-13 DIAGNOSIS — Z13220 Encounter for screening for lipoid disorders: Secondary | ICD-10-CM

## 2019-01-13 DIAGNOSIS — N920 Excessive and frequent menstruation with regular cycle: Secondary | ICD-10-CM

## 2019-01-13 MED ORDER — AZELASTINE-FLUTICASONE 137-50 MCG/ACT NA SUSP: 1.0000 | Freq: Two times a day (BID) | NASAL | 3 refills | Status: DC

## 2019-01-13 NOTE — Assessment & Plan Note (Signed)
Last evaluation by GI specialty was 2016.

## 2019-01-13 NOTE — Progress Notes (Signed)
Name: Brianna Phillips   MRN: [id number]    DOB: 26-May-1981   Date:01/13/2019       Progress Note  Subjective  Chief Complaint  Chief Complaint  Patient presents with  . Establish Care  . Female GU Problem    Had abnormal cells on last pap smear. She was suppose to go see a specialist.    HPI  Patient presents for annual CPE and to establish care with the following concerns:  Sinus Issues: Chronic cough for over a year, was seen at [facility] for the issue several times.  She notes issues ongoing with the LEFT maxillary sinus stays tender; always carries PND with her cough.Xyzal has been very successful for her.  Symptoms have improved since March 2020.  She completed a course of keflex about 2 weeks ago, improved, but is already feeling significant worsening.  She is using flonase daily; will add azelastine, but if not improving in 2-3 weeks will call if not improving and we will refer to ENT (Dr. Tami Ribas)  Hx stomach ulcers: Denies family or personal history of colorectal cancer, mucus in stool.  She does report occasional blood in her stool (dark and tarry) - has history of stomach ulcer.  She also notes some constipation recently since changing her routine; also having some bloating.  We will refer to GI today.   Fatigue: Has had fatigue for many years. She has history of stomach ulcers - will check CBC.  She has struggled with anxiety in the past (divorced about 6 years ago), but feels that this has been well controlled lately.  Possibly has history of vitamin D deficiency.   Diet: Eating 3-5 small meals from scratch daily.  Avoids processed foods due to GI history.  Exercise: 15-45 mins exercise daily - used to go to the gym.  Her children are all teenagers and she has dogs that she plays outside with.   USPSTF grade A and B recommendations  Depression: Phq 9 is  negative Depression screen [facility] 2/9 01/13/2019  Decreased Interest 0  Down, Depressed, Hopeless 0  PHQ - 2 Score 0   Altered sleeping 0  Tired, decreased energy 0  Change in appetite 0  Feeling bad or failure about yourself  0  Trouble concentrating 0  Moving slowly or fidgety/restless 0  Suicidal thoughts 0  PHQ-9 Score 0   Hypertension: BP Readings from Last 3 Encounters:  01/13/19 124/72  07/11/15 (!) 141/100  01/14/15 (!) 137/93   Obesity: Wt Readings from Last 3 Encounters:  01/13/19 125 lb 9.6 oz (57 kg)  07/11/15 132 lb (59.9 kg)  03/14/13 150 lb (68 kg)   BMI Readings from Last 3 Encounters:  01/13/19 22.97 kg/m  07/11/15 24.14 kg/m  03/14/13 27.44 kg/m    Hep C Screening: Declines STD testing and prevention (HIV/chl/gon/syphilis): Declines all testing today.  No new partners in the last years. Intimate partner violence: No concerns Sexual History/Pain during Intercourse:  No concerns Menstrual History/LMP/Abnormal Bleeding: She does have heavy and painful menses monthly.  She took OCP a very long time ago. She does have tubal ligation by [facility] in 2006. In 2010 she noticed slowly worsening dysmenorrhea.  She notes significant bloating around her menses.  Would like to discuss options with GYN - will provide referral today Incontinence Symptoms: No concerns.  Advanced Care Planning: A voluntary discussion about advance care planning including the explanation and discussion of advance directives.  Discussed health care proxy and Living  will, and the patient was able to identify a health care proxy as [employer] manager (Son) and Fernanda Drum (Boyfriend).  Patient does not have a living will at present time. If patient does have living will, I have requested they bring this to the clinic to be scanned in to their chart.  Breast cancer: Has history of breast cyst that was aspirated in the past which was found to be benign. Due for that today No results found for: [facility]  BRCA gene screening: Has family history Cervical cancer screening: Due today  Osteoporosis Screening: We  will check vitamin D level; exercises regularly. No results found for: HMDEXASCAN  Lipids: We will check today No results found for: CHOL No results found for: HDL No results found for: LDLCALC No results found for: TRIG No results found for: CHOLHDL No results found for: LDLDIRECT  Glucose:  Glucose, Bld  Date Value Ref Range Status  03/14/2013 112 (H) 70 - 99 mg/dL Final  01/23/2012 81 70 - 99 mg/dL Final    Skin cancer: No concerning lesions Colorectal cancer: Denies family or personal history of colorectal cancer, mucus in stool.  She does report occasional blood in her stool (dark and tarry) - has history of stomach ulcer.  See above regarding this further. Lung cancer:  Former smoker - quit 2014.  Low Dose CT Chest recommended if Age 68-80 years, 30 pack-year currently smoking OR have quit w/in 15years. Patient does not qualify.   ECG: Denies chest pain, shortness of breath, or palpitations.  Patient Active Problem List   Diagnosis Date Noted  . Stomach ulcer 01/13/2019    Past Surgical History:  Procedure Laterality Date  . DILATION AND CURETTAGE OF UTERUS    . FINGER SURGERY    . FRACTURE SURGERY     Left Wrist  . I&D EXTREMITY  01/23/2012   Procedure: IRRIGATION AND DEBRIDEMENT EXTREMITY;  Surgeon: Linna Hoff, MD;  Location: [place];  Service: Orthopedics;  Laterality: Left;  Left Index Finger  . joint arthorplasty Left    left wrist temporaomandibular    Family History  Problem Relation Age of Onset  . Breast cancer Paternal Aunt 30       times three  . Breast cancer Maternal Grandmother     Social History   Socioeconomic History  . Marital status: Divorced    Spouse name: Not on file  . Number of children: Not on file  . Years of education: Not on file  . Highest education level: Not on file  Occupational History  . Not on file  Social Needs  . Financial resource strain: Not on file  . Food insecurity:    Worry: Not on file    Inability: Not on  file  . Transportation needs:    Medical: Not on file    Non-medical: Not on file  Tobacco Use  . Smoking status: Former Research scientist (life sciences)  . Smokeless tobacco: Never Used  Substance and Sexual Activity  . Alcohol use: Yes    Comment: Ocassional  . Drug use: No  . Sexual activity: Not on file  Lifestyle  . Physical activity:    Days per week: Not on file    Minutes per session: Not on file  . Stress: Not on file  Relationships  . Social connections:    Talks on phone: Not on file    Gets together: Not on file    Attends religious service: Not on file    Active member  of club or organization: Not on file    Attends meetings of clubs or organizations: Not on file    Relationship status: Not on file  . Intimate partner violence:    Fear of current or ex partner: Not on file    Emotionally abused: Not on file    Physically abused: Not on file    Forced sexual activity: Not on file  Other Topics Concern  . Not on file  Social History Narrative  . Not on file     Current Outpatient Medications:  .  cyclobenzaprine (FLEXERIL) 5 MG tablet, Take 1 tablet (5 mg total) by mouth every 8 (eight) hours as needed for muscle spasms., Disp: 30 tablet, Rfl: 0 .  ibuprofen (ADVIL,MOTRIN) 800 MG tablet, Take 1 tablet (800 mg total) by mouth every 8 (eight) hours as needed., Disp: 30 tablet, Rfl: 0 .  OVER THE COUNTER MEDICATION, Take 1 capsule by mouth at bedtime. OTC "Detox regimen", Disp: , Rfl:  .  pantoprazole (PROTONIX) 20 MG tablet, Take 2 tablets (40 mg total) by mouth daily., Disp: 30 tablet, Rfl: 0 .  ranitidine (ZANTAC) 150 MG tablet, Take 150 mg by mouth 3 (three) times daily as needed for heartburn. , Disp: , Rfl:  .  sucralfate (CARAFATE) 1 G tablet, Take 1 tablet (1 g total) by mouth 4 (four) times daily., Disp: 40 tablet, Rfl: 1  Allergies  Allergen Reactions  . Penicillins Hives  . Other Swelling and Rash    Eggplant allergy     ROS  Constitutional: Negative for fever or  weight change.  Respiratory: Negative for cough and shortness of breath.   Cardiovascular: Negative for chest pain or palpitations.  Gastrointestinal: Negative for abdominal pain; see HPI Musculoskeletal: Negative for gait problem or joint swelling.  Skin: Negative for rash.  Neurological: Negative for dizziness or headache.  No other specific complaints in a complete review of systems (except as listed in HPI above).  Objective  Vitals:   01/13/19 1334  BP: 124/72  Pulse: 97  Resp: 16  Temp: 98.6 F (37 C)  TempSrc: Oral  SpO2: 99%  Weight: 125 lb 9.6 oz (57 kg)  Height: '5\' 2"'$  (1.575 m)    Body mass index is 22.97 kg/m.  Physical Exam Constitutional: Patient appears well-developed and well-nourished. No distress.  HENT: Head: Normocephalic and atraumatic. Ears: bilateral TMs with no erythema or effusion; Nose: Nose normal. Mouth/Throat: Oropharynx is clear and moist. No oropharyngeal exudate or tonsillar swelling.  Eyes: Conjunctivae and EOM are normal. No scleral icterus.  Pupils are equal, round, and reactive to light.  Neck: Normal range of motion. Neck supple. No JVD present. No thyromegaly present.  Cardiovascular: Normal rate, regular rhythm and normal heart sounds.  No murmur heard. No BLE edema. Pulmonary/Chest: Effort normal and breath sounds normal. No respiratory distress. Abdominal: Soft. Bowel sounds are normal, no distension. There is no tenderness. No masses. Musculoskeletal: Normal range of motion, no joint effusions. No gross deformities Neurological: Pt is alert and oriented to person, place, and time. No cranial nerve deficit. Coordination, balance, strength, speech and gait are normal.  Skin: Skin is warm and dry. No rash noted. No erythema.  Psychiatric: Patient has a normal mood and affect. behavior is normal. Judgment and thought content normal.  No results found for this or any previous visit (from the past 2160 hour(s)).  PHQ2/9: Depression screen  PHQ 2/9 01/13/2019  Decreased Interest 0  Down, Depressed, Hopeless 0  PHQ -  2 Score 0  Altered sleeping 0  Tired, decreased energy 0  Change in appetite 0  Feeling bad or failure about yourself  0  Trouble concentrating 0  Moving slowly or fidgety/restless 0  Suicidal thoughts 0  PHQ-9 Score 0   Fall Risk: Fall Risk  01/13/2019  Falls in the past year? 0  Number falls in past yr: 0  Injury with Fall? 0   Functional Status Survey: Is the patient deaf or have difficulty hearing?: No Does the patient have difficulty seeing, even when wearing glasses/contacts?: No Does the patient have difficulty concentrating, remembering, or making decisions?: No Does the patient have difficulty walking or climbing stairs?: No Does the patient have difficulty dressing or bathing?: No Does the patient have difficulty doing errands alone such as visiting a doctor's office or shopping?: No  Assessment & Plan  1. Well woman exam with routine gynecological exam -USPSTF grade A and B recommendations reviewed with patient; age-appropriate recommendations, preventive care, screening tests, etc discussed and encouraged; healthy living encouraged; see AVS for patient education given to patient -Discussed importance of 150 minutes of physical activity weekly, eat two servings of fish weekly, eat one serving of tree nuts ( cashews, pistachios, pecans, almonds.Marland Kitchen) every other day, eat 6 servings of fruit/vegetables daily and drink plenty of water and avoid sweet beverages.  - Cytology - PAP - MM 3D SCREEN BREAST BILATERAL; Future  2. Encounter to establish care  3. Gastric ulcer without hemorrhage or perforation, unspecified chronicity - COMPLETE METABOLIC PANEL WITH GFR - CBC with Differential/Platelet - Ambulatory referral to Gastroenterology  4. Chronic rhinitis - Azelastine-Fluticasone (DYMISTA) 137-50 MCG/ACT SUSP; Place 1 spray into the nose 2 (two) times daily.  Dispense: 23 g; Refill: 3 - Will trial  along with Xyzal for about 2 weeks, if not improving will send to ENT - she will call back for referral if needed.  5. Dysmenorrhea - CBC with Differential/Platelet - Ambulatory referral to Gynecology  6. Menorrhagia with regular cycle - CBC with Differential/Platelet - Ambulatory referral to Gynecology  7. Fatigue, unspecified type - COMPLETE METABOLIC PANEL WITH GFR - TSH - CBC with Differential/Platelet - VITAMIN D 25 Hydroxy (Vit-D Deficiency, Fractures)  8. Lipid screening - Lipid panel  9. Breast cancer screening - MM 3D SCREEN BREAST BILATERAL; Future

## 2019-01-14 DIAGNOSIS — N946 Dysmenorrhea, unspecified: Secondary | ICD-10-CM | POA: Insufficient documentation

## 2019-01-14 DIAGNOSIS — N920 Excessive and frequent menstruation with regular cycle: Secondary | ICD-10-CM | POA: Insufficient documentation

## 2019-01-14 DIAGNOSIS — R5383 Other fatigue: Secondary | ICD-10-CM | POA: Insufficient documentation

## 2019-01-14 DIAGNOSIS — J310 Chronic rhinitis: Secondary | ICD-10-CM | POA: Insufficient documentation

## 2019-01-14 LAB — CBC WITH DIFFERENTIAL/PLATELET
Absolute Monocytes: 633 cells/uL (ref 200–950)
Basophils Absolute: 40 cells/uL (ref 0–200)
Basophils Relative: 0.7 %
Eosinophils Absolute: 120 cells/uL (ref 15–500)
Eosinophils Relative: 2.1 %
HCT: 40.7 % (ref 35.0–45.0)
Hemoglobin: 13.7 g/dL (ref 11.7–15.5)
Lymphs Abs: 1231 cells/uL (ref 850–3900)
MCH: 32.7 pg (ref 27.0–33.0)
MCHC: 33.7 g/dL (ref 32.0–36.0)
MCV: 97.1 fL (ref 80.0–100.0)
MPV: 11.2 fL (ref 7.5–12.5)
Monocytes Relative: 11.1 %
Neutro Abs: 3677 cells/uL (ref 1500–7800)
Neutrophils Relative %: 64.5 %
Platelets: 269 10*3/uL (ref 140–400)
RBC: 4.19 10*6/uL (ref 3.80–5.10)
RDW: 11.9 % (ref 11.0–15.0)
Total Lymphocyte: 21.6 %
WBC: 5.7 10*3/uL (ref 3.8–10.8)

## 2019-01-14 LAB — COMPLETE METABOLIC PANEL WITH GFR
AG Ratio: 1.8 (calc) (ref 1.0–2.5)
ALT: 15 U/L (ref 6–29)
AST: 18 U/L (ref 10–30)
Albumin: 4.4 g/dL (ref 3.6–5.1)
Alkaline phosphatase (APISO): 55 U/L (ref 31–125)
BUN: 15 mg/dL (ref 7–25)
CO2: 24 mmol/L (ref 20–32)
Calcium: 9 mg/dL (ref 8.6–10.2)
Chloride: 104 mmol/L (ref 98–110)
Creat: 0.87 mg/dL (ref 0.50–1.10)
GFR, Est African American: 97 mL/min/{1.73_m2} (ref >=60)
GFR, Est Non African American: 84 mL/min/{1.73_m2} (ref >=60)
Globulin: 2.5 g/dL (calc) (ref 1.9–3.7)
Glucose, Bld: 86 mg/dL (ref 65–99)
Potassium: 4 mmol/L (ref 3.5–5.3)
Sodium: 139 mmol/L (ref 135–146)
Total Bilirubin: 0.6 mg/dL (ref 0.2–1.2)
Total Protein: 6.9 g/dL (ref 6.1–8.1)

## 2019-01-14 LAB — LIPID PANEL
Cholesterol: 189 mg/dL (ref <200)
HDL: 83 mg/dL (ref >=50)
LDL Cholesterol (Calc): 87 mg/dL (calc)
Non-HDL Cholesterol (Calc): 106 mg/dL (calc) (ref <130)
Total CHOL/HDL Ratio: 2.3 (calc) (ref <5.0)
Triglycerides: 91 mg/dL (ref <150)

## 2019-01-14 LAB — TSH: TSH: 1.53 mIU/L

## 2019-01-14 LAB — VITAMIN D 25 HYDROXY (VIT D DEFICIENCY, FRACTURES): Vit D, 25-Hydroxy: 30 ng/mL (ref 30–100)

## 2019-01-16 ENCOUNTER — Telehealth: Payer: Self-pay | Admitting: Family Medicine

## 2019-01-16 LAB — CYTOLOGY - PAP: Diagnosis: NEGATIVE

## 2019-01-16 NOTE — Telephone Encounter (Signed)
Copied from [place] [id number]. Topic: Quick Communication - Rx Refill/Question >> Jan 16, 2019  3:32 PM Scherrie Gerlach wrote: Medication: Azelastine-Fluticasone (DYMISTA) 137-50 MCG/ACT SUSP  Pt states the pharmacy has to order this rx, it will take a week to get to he. but pt states she is desperate for another rx to get her through until this comes in. She statesher eyes are really swollen, head feels like it is in a vice grip. She would like a call back asap. Would like another med please  Maitland Conroy, [place] [phone] (Phone) [phone] (Fax)

## 2019-01-19 ENCOUNTER — Other Ambulatory Visit: Payer: Self-pay | Admitting: Emergency Medicine

## 2019-01-19 DIAGNOSIS — J310 Chronic rhinitis: Secondary | ICD-10-CM

## 2019-01-19 MED ORDER — AZELASTINE-FLUTICASONE 137-50 MCG/ACT NA SUSP: 1.0000 | Freq: Two times a day (BID) | NASAL | 3 refills | Status: DC

## 2019-01-19 NOTE — Telephone Encounter (Signed)
Order is pend if you plan to refill

## 2019-01-20 ENCOUNTER — Other Ambulatory Visit: Payer: Self-pay

## 2019-01-20 ENCOUNTER — Encounter: Payer: Self-pay | Admitting: Obstetrics and Gynecology

## 2019-01-20 ENCOUNTER — Ambulatory Visit (INDEPENDENT_AMBULATORY_CARE_PROVIDER_SITE_OTHER): Payer: BC Managed Care – PPO | Admitting: Obstetrics and Gynecology

## 2019-01-20 VITALS — BP 130/90 | Ht 62.0 in | Wt 132.2 lb

## 2019-01-20 DIAGNOSIS — N921 Excessive and frequent menstruation with irregular cycle: Secondary | ICD-10-CM

## 2019-01-20 DIAGNOSIS — Z3169 Encounter for other general counseling and advice on procreation: Secondary | ICD-10-CM

## 2019-01-20 DIAGNOSIS — R1031 Right lower quadrant pain: Secondary | ICD-10-CM | POA: Diagnosis not present

## 2019-01-20 DIAGNOSIS — Z803 Family history of malignant neoplasm of breast: Secondary | ICD-10-CM | POA: Diagnosis not present

## 2019-01-20 NOTE — Progress Notes (Deleted)
[name], FNP   No chief complaint on file.   HPI:      [name] is a 38 y.o. No obstetric history on file. who LMP was No LMP recorded., presents today for NP eval for menorrhagia.  Last pap 6/20 were neg cells with PCP.    Past Medical History:  Diagnosis Date  . Gastric ulcer   . TMJ (dislocation of temporomandibular joint)     Past Surgical History:  Procedure Laterality Date  . DILATION AND CURETTAGE OF UTERUS    . FINGER SURGERY    . FRACTURE SURGERY     Left Wrist  . I&D EXTREMITY  01/23/2012   Procedure: IRRIGATION AND DEBRIDEMENT EXTREMITY;  Surgeon: Linna Hoff, MD;  Location: [place];  Service: Orthopedics;  Laterality: Left;  Left Index Finger  . joint arthorplasty Left    left wrist temporaomandibular    Family History  Problem Relation Age of Onset  . Breast cancer Paternal Aunt 30       times three  . Breast cancer Maternal Grandmother     Social History   Socioeconomic History  . Marital status: Divorced    Spouse name: Not on file  . Number of children: Not on file  . Years of education: Not on file  . Highest education level: Not on file  Occupational History  . Not on file  Social Needs  . Financial resource strain: Not on file  . Food insecurity    Worry: Not on file    Inability: Not on file  . Transportation needs    Medical: Not on file    Non-medical: Not on file  Tobacco Use  . Smoking status: Former Research scientist (life sciences)  . Smokeless tobacco: Never Used  Substance and Sexual Activity  . Alcohol use: Yes    Comment: Ocassional  . Drug use: No  . Sexual activity: Not on file  Lifestyle  . Physical activity    Days per week: Not on file    Minutes per session: Not on file  . Stress: Not on file  Relationships  . Social Herbalist on phone: Not on file    Gets together: Not on file    Attends religious service: Not on file    Active member of club or organization: Not on file    Attends meetings of  clubs or organizations: Not on file    Relationship status: Not on file  . Intimate partner violence    Fear of current or ex partner: Not on file    Emotionally abused: Not on file    Physically abused: Not on file    Forced sexual activity: Not on file  Other Topics Concern  . Not on file  Social History Narrative  . Not on file    Outpatient Medications Prior to Visit  Medication Sig Dispense Refill  . APPLE CIDER VINEGAR PO Take by mouth.    . Azelastine-Fluticasone (DYMISTA) 137-50 MCG/ACT SUSP Place 1 spray into the nose 2 (two) times daily. 23 g 3  . levocetirizine (XYZAL) 5 MG tablet Take 5 mg by mouth every evening.    Marland Kitchen OVER THE COUNTER MEDICATION Take 1 capsule by mouth at bedtime. OTC "Detox regimen"    . Probiotic Product (CULTURELLE PROBIOTICS PO) Take by mouth.     No facility-administered medications prior to visit.       ROS:  Review of Systems BREAST: No symptoms  OBJECTIVE:   Vitals:  There were no vitals taken for this visit.  Physical Exam  Results: No results found for this or any previous visit (from the past 24 hour(s)).   Assessment/Plan: No diagnosis found.    No orders of the defined types were placed in this encounter.     No follow-ups on file.  Alicia B. Copland, PA-C 01/20/2019 10:11 AM

## 2019-01-20 NOTE — Progress Notes (Signed)
[name], FNP   Chief Complaint  Patient presents with  . Menorrhagia    very heavy to change tampons every 15-20 mins for the last year  . Dysmenorrhea    entire pelvic area    HPI:      [name] is a 38 y.o. No obstetric history on file. who LMP was Patient's last menstrual period was 01/06/2019 (approximate)., presents today for NP eval of  Menometrorrhagia, referred by PCP.  Pt's menses are monthly, lasting 4-5 days, with 3 heavy days changing tampons Q15-20 min with soiling of clothes. Has dime to quarter sized clots. No BTB, mod dysmen, slightly improved with tylenol, but can't take much due to GI ulcers. Has to miss work/activities due to periods. Flow/pain has worsened in past 1 1/2 yrs. Normal CBC, TSH and pap 6/20 with PCP. FH leio in mom and sister.   Also has non-menstrual pelvic pain, particularly RLQ intermittently. Feels like an ache. No aggrav factors per se. No vag, urin sx. Has bloating/constipation but sx improved with probiotic use. Has GI ref.  Pt is sex active, has clips for TL that can be reversed. Pt may want to conceive again and would like to address that after bleeding eval. Pt denies bleeding/pain with sex. Did OCPs and xulane in past without side effects. No hx of DVTs, HTN, seizures, migraines.   Pt with FH breast/ovar cancer, unsure if genetic testing done. Pt getting mammos yearly already with PCP.   Past Medical History:  Diagnosis Date  . Family history of breast cancer   . Family history of ovarian cancer   . Gastric ulcer   . TMJ (dislocation of temporomandibular joint)     Past Surgical History:  Procedure Laterality Date  . DILATION AND CURETTAGE OF UTERUS    . FINGER SURGERY    . FRACTURE SURGERY     Left Wrist  . I&D EXTREMITY  01/23/2012   Procedure: IRRIGATION AND DEBRIDEMENT EXTREMITY;  Surgeon: Linna Hoff, MD;  Location: [place];  Service: Orthopedics;  Laterality: Left;  Left Index Finger  . joint arthorplasty  Left    left wrist temporaomandibular    Family History  Problem Relation Age of Onset  . Breast cancer Maternal Grandmother        young  . Breast cancer Maternal Aunt        young  . Ovarian cancer Maternal Aunt     Social History   Socioeconomic History  . Marital status: Divorced    Spouse name: Not on file  . Number of children: Not on file  . Years of education: Not on file  . Highest education level: Not on file  Occupational History  . Not on file  Social Needs  . Financial resource strain: Not on file  . Food insecurity    Worry: Not on file    Inability: Not on file  . Transportation needs    Medical: Not on file    Non-medical: Not on file  Tobacco Use  . Smoking status: Former Research scientist (life sciences)  . Smokeless tobacco: Never Used  Substance and Sexual Activity  . Alcohol use: Yes    Comment: Ocassional  . Drug use: No  . Sexual activity: Yes  Lifestyle  . Physical activity    Days per week: Not on file    Minutes per session: Not on file  . Stress: Not on file  Relationships  . Social connections  Talks on phone: Not on file    Gets together: Not on file    Attends religious service: Not on file    Active member of club or organization: Not on file    Attends meetings of clubs or organizations: Not on file    Relationship status: Not on file  . Intimate partner violence    Fear of current or ex partner: Not on file    Emotionally abused: Not on file    Physically abused: Not on file    Forced sexual activity: Not on file  Other Topics Concern  . Not on file  Social History Narrative  . Not on file    Outpatient Medications Prior to Visit  Medication Sig Dispense Refill  . APPLE CIDER VINEGAR PO Take by mouth.    . Azelastine-Fluticasone (DYMISTA) 137-50 MCG/ACT SUSP Place 1 spray into the nose 2 (two) times daily. 23 g 3  . levocetirizine (XYZAL) 5 MG tablet Take 5 mg by mouth every evening.    Marland Kitchen OVER THE COUNTER MEDICATION Take 1 capsule by mouth  at bedtime. OTC "Detox regimen"    . Probiotic Product (CULTURELLE PROBIOTICS PO) Take by mouth.     No facility-administered medications prior to visit.       ROS:  Review of Systems  Constitutional: Negative for fatigue, fever and unexpected weight change.  Respiratory: Negative for cough, shortness of breath and wheezing.   Cardiovascular: Negative for chest pain, palpitations and leg swelling.  Gastrointestinal: Positive for abdominal distention. Negative for blood in stool, constipation, diarrhea, nausea and vomiting.  Endocrine: Negative for cold intolerance, heat intolerance and polyuria.  Genitourinary: Positive for menstrual problem. Negative for dyspareunia, dysuria, flank pain, frequency, genital sores, hematuria, pelvic pain, urgency, vaginal bleeding, vaginal discharge and vaginal pain.  Musculoskeletal: Negative for back pain, joint swelling and myalgias.  Skin: Negative for rash.  Neurological: Negative for dizziness, syncope, light-headedness, numbness and headaches.  Hematological: Negative for adenopathy.  Psychiatric/Behavioral: Negative for agitation, confusion, sleep disturbance and suicidal ideas. The patient is not nervous/anxious.   BREAST: No symptoms   OBJECTIVE:   Vitals:  BP 130/90   Ht 5\' 2"  (1.575 m)   Wt 132 lb 3.2 oz (60 kg)   LMP 01/06/2019 (Approximate)   BMI 24.18 kg/m   Physical Exam Vitals signs reviewed.  Constitutional:      Appearance: She is well-developed.  Neck:     Musculoskeletal: Normal range of motion.  Pulmonary:     Effort: Pulmonary effort is normal.  Abdominal:     Palpations: Abdomen is soft.     Tenderness: There is abdominal tenderness in the right lower quadrant. There is rebound. There is no guarding.  Genitourinary:    General: Normal vulva.     Pubic Area: No rash.      Labia:        Right: No rash, tenderness or lesion.        Left: No rash, tenderness or lesion.      Vagina: Normal. No vaginal discharge,  erythema or tenderness.     Cervix: Normal.     Uterus: Normal. Not enlarged and not tender.      Adnexa: Left adnexa normal.       Right: Tenderness present. No mass.         Left: No mass or tenderness.    Musculoskeletal: Normal range of motion.  Skin:    General: Skin is warm and dry.  Neurological:  General: No focal deficit present.     Mental Status: She is alert and oriented to person, place, and time.  Psychiatric:        Mood and Affect: Mood normal.        Behavior: Behavior normal.        Thought Content: Thought content normal.        Judgment: Judgment normal.     Assessment/Plan: Menometrorrhagia - Plan: Normal CBC, TSH with PCP. Neg exam. Check GYN u/s. Will call with results. Discussed tx options but pt is interested in possible conception in near future.   RLQ abdominal pain - Plan: Check u/s.   Pre-conception counseling - Plan: Will see what u/s shows, as well as discuss TL reversal since has clips. Surg with Dr. Kenton Kingfisher in past.  Family history of breast cancer- Discussed MyRisk testing, handout given. Pt to consider and f/u if desires. Pt also to clarify FH.  Orders Placed This Encounter  Procedures  . US PELVIS TRANSVANGINAL NON-OB (TV ONLY)    Standing Status:   Future    Standing Expiration Date:   03/21/2020    Order Specific Question:   Reason for Exam (SYMPTOM  OR DIAGNOSIS REQUIRED)    Answer:   menometrorrhagia/RLQ pain    Order Specific Question:   Preferred imaging location?    Answer:   External    Comments:   [facility]      Return in about 1 day (around 01/21/2019) for GYN u/s for menometrorrhagia--[facility] to call pt.  Alicia B. Copland, PA-C 01/20/2019 4:00 PM

## 2019-01-20 NOTE — Patient Instructions (Signed)
I value your feedback and entrusting us with your care. If you get a [facility] patient survey, I would appreciate you taking the time to let us know about your experience today. Thank you! 

## 2019-01-21 ENCOUNTER — Encounter: Payer: Self-pay | Admitting: *Deleted

## 2019-01-27 ENCOUNTER — Ambulatory Visit (INDEPENDENT_AMBULATORY_CARE_PROVIDER_SITE_OTHER): Payer: BC Managed Care – PPO

## 2019-01-27 ENCOUNTER — Other Ambulatory Visit: Payer: Self-pay

## 2019-01-27 DIAGNOSIS — R1031 Right lower quadrant pain: Secondary | ICD-10-CM | POA: Diagnosis not present

## 2019-01-27 DIAGNOSIS — N8311 Corpus luteum cyst of right ovary: Secondary | ICD-10-CM | POA: Diagnosis not present

## 2019-01-27 DIAGNOSIS — N921 Excessive and frequent menstruation with irregular cycle: Secondary | ICD-10-CM | POA: Diagnosis not present

## 2019-01-28 ENCOUNTER — Telehealth: Payer: Self-pay | Admitting: Obstetrics and Gynecology

## 2019-01-28 DIAGNOSIS — N921 Excessive and frequent menstruation with irregular cycle: Secondary | ICD-10-CM

## 2019-01-28 MED ORDER — NORETHIN ACE-ETH ESTRAD-FE 1-20 MG-MCG(24) PO TABS: 1.0000 | ORAL_TABLET | Freq: Every day | ORAL | 1 refills | Status: DC

## 2019-01-28 NOTE — Telephone Encounter (Signed)
Pt aware of Gyn u/s results for menorrhagia/RLQ pain. No DUB. Pt interested in TL clip reversal but since elective surg and will be delayed due to covid, pt interested in sx relief currently. Will try lomedia OCPs, start Sun with this menses. LMP 01/27/19. Will have pt f/u with DR. Harris re: TL procedure.   Meds ordered this encounter  Medications  . Norethindrone Acetate-Ethinyl Estrad-FE (MICROGESTIN 24 FE) 1-20 MG-MCG(24) tablet    Sig: Take 1 tablet by mouth daily.    Dispense:  84 tablet    Refill:  1    Order Specific Question:   Supervising Provider    Answer:   Gae Dry [[id number]]     ULTRASOUND REPORT  Location: [facility]  Date of Service: 01/27/2019   Indications:Pelvic Pain and menometrorrhagia  Findings:   The uterus is retroverted and measures 8.3 x 5.8 x 5.1cm. Echo texture is homogenous without evidence of focal masses.  The Endometrium measures 13.0 mm. There are no obvious endometrial polyps or fibroids.  Right Ovary measures 2.9 x 1.8 x 1.5 cm. There is a right corpus luteal cyst measuring 1.4 x 1.4 x 1.7 cm.   Left Ovary measures 2.4 x 1.6 x 1.0 cm. It is normal in appearance.  Survey of the adnexa demonstrates no adnexal masses. There is scant free fluid around the left ovary.  Impression: 1. Normal appearing uterus,cervix and left ovary. 2. There is a corpus luteal cyst in the right ovary.  Gweneth Dimitri, RT   The ultrasound images and findings were reviewed by me and I agree with the above report.  Prentice Docker, MD, [facility], [facility] 01/28/2019 1:14 AM

## 2019-02-05 NOTE — Telephone Encounter (Signed)
Dr. Kenton Kingfisher reviewed pt's chart. She would need REI ref for clip removal and reanastomosis for conception. Pt aware. F/u prn ref. Cont OCPs for now.

## 2019-03-04 ENCOUNTER — Ambulatory Visit: Payer: BC Managed Care – PPO | Admitting: Gastroenterology

## 2019-03-17 ENCOUNTER — Encounter: Payer: Self-pay | Admitting: Gastroenterology

## 2019-03-17 ENCOUNTER — Ambulatory Visit: Payer: BC Managed Care – PPO | Admitting: Gastroenterology

## 2019-03-26 ENCOUNTER — Encounter: Payer: Self-pay | Admitting: Emergency Medicine

## 2019-03-26 ENCOUNTER — Other Ambulatory Visit: Payer: Self-pay

## 2019-03-26 DIAGNOSIS — Z87891 Personal history of nicotine dependence: Secondary | ICD-10-CM | POA: Diagnosis not present

## 2019-03-26 DIAGNOSIS — Z0279 Encounter for issue of other medical certificate: Secondary | ICD-10-CM | POA: Diagnosis not present

## 2019-03-26 DIAGNOSIS — Z8543 Personal history of malignant neoplasm of ovary: Secondary | ICD-10-CM | POA: Diagnosis not present

## 2019-03-26 NOTE — ED Triage Notes (Addendum)
Pt presents to ED in custody with [employer]. Pt was arrested and taken to the jail but answered yes to her COVID questions. Jail needs pt medically cleared before she can return. Pt states she works in an [employer] and is unsure if she has any positive exposures to [place]. Pt currently denies any injury, illness, or symptoms such as cough congestion fever sore throat or sob.

## 2019-03-27 ENCOUNTER — Emergency Department
Admission: EM | Admit: 2019-03-27 | Discharge: 2019-03-27 | Payer: BC Managed Care – PPO | Attending: Emergency Medicine | Admitting: Emergency Medicine

## 2019-03-27 DIAGNOSIS — Z008 Encounter for other general examination: Secondary | ICD-10-CM

## 2019-03-27 HISTORY — DX: Malignant (primary) neoplasm, unspecified: C80.1

## 2019-03-27 NOTE — ED Provider Notes (Signed)
[facility] Emergency Department Provider Note    First MD Initiated Contact with Patient 03/27/19 0207     (approximate)  I have reviewed the triage vital signs and the nursing notes.   HISTORY  Chief Complaint Medical Clearance    HPI Brianna Phillips is a 38 y.o. female presents to the emergency department in police custody with request for medical clearance for incarceration.  Per the police officer due to the patient's negative answers for COVID screening the jail requested that the patient be brought to the emergency department for medical clearance.  Patient denies any recent illness no dyspnea no cough.  Patient denies any loss of taste or smell.  Patient denies any nausea vomiting diarrhea.  Patient does admit to being in contact with someone who had COVID-19 at her place of employment proximally 5 days ago.  Of note patient was tested for COVID-19 1 week ago.        Past Medical History:  Diagnosis Date  . Cancer ([place])    ovarian   . Family history of breast cancer   . Family history of ovarian cancer   . Gastric ulcer   . TMJ (dislocation of temporomandibular joint)     Patient Active Problem List   Diagnosis Date Noted  . Chronic rhinitis 01/14/2019  . Dysmenorrhea 01/14/2019  . Menorrhagia with regular cycle 01/14/2019  . Fatigue 01/14/2019  . Stomach ulcer 01/13/2019  . Family history of breast cancer 01/13/2019    Past Surgical History:  Procedure Laterality Date  . DILATION AND CURETTAGE OF UTERUS    . FINGER SURGERY    . FRACTURE SURGERY     Left Wrist  . I&D EXTREMITY  01/23/2012   Procedure: IRRIGATION AND DEBRIDEMENT EXTREMITY;  Surgeon: Linna Hoff, MD;  Location: [place];  Service: Orthopedics;  Laterality: Left;  Left Index Finger  . joint arthorplasty Left    left wrist temporaomandibular    Prior to Admission medications   Medication Sig Start Date End Date Taking? Authorizing Provider  APPLE CIDER VINEGAR PO  Take by mouth.    [provider]  Azelastine-Fluticasone (DYMISTA) 137-50 MCG/ACT SUSP Place 1 spray into the nose 2 (two) times daily. 01/19/19   [name], FNP  levocetirizine (XYZAL) 5 MG tablet Take 5 mg by mouth every evening.    [provider]  Norethindrone Acetate-Ethinyl Estrad-FE (MICROGESTIN 24 FE) 1-20 MG-MCG(24) tablet Take 1 tablet by mouth daily. [id number]   Copland, Elmo Putt B, PA-C  OVER THE COUNTER MEDICATION Take 1 capsule by mouth at bedtime. OTC "Detox regimen"    [provider]  Probiotic Product (CULTURELLE PROBIOTICS PO) Take by mouth.    [provider]    Allergies Penicillins and Other  Family History  Problem Relation Age of Onset  . Breast cancer Maternal Grandmother        young  . Breast cancer Maternal Aunt        young  . Ovarian cancer Maternal Aunt     Social History Social History   Tobacco Use  . Smoking status: Former Research scientist (life sciences)  . Smokeless tobacco: Never Used  Substance Use Topics  . Alcohol use: Yes    Comment: Ocassional  . Drug use: No    Review of Systems Constitutional: No fever/chills Eyes: No visual changes. ENT: No sore throat. Cardiovascular: Denies chest pain. Respiratory: Denies shortness of breath. Gastrointestinal: No abdominal pain.  No nausea, no vomiting.  No diarrhea.  No constipation. Genitourinary: Negative for dysuria. Musculoskeletal: Negative for neck pain.  Negative for back pain. Integumentary: Negative for rash. Neurological: Negative for headaches, focal weakness or numbness.   ____________________________________________   PHYSICAL EXAM:  VITAL SIGNS: ED Triage Vitals  Enc Vitals Group     BP 03/26/19 2316 (!) 171/109     Pulse Rate 03/26/19 2316 (!) 111     Resp 03/26/19 2316 20     Temp 03/26/19 2316 98.5 F (36.9 C)     Temp Source 03/26/19 2316 Oral     SpO2 03/26/19 2316 95 %     Weight 03/26/19 2319 56.7 kg (125 lb)     Height 03/26/19 2319 1.575 m  (5\' 2" )     Head Circumference --      Peak Flow --      Pain Score 03/26/19 2317 0     Pain Loc --      Pain Edu? --      Excl. in [place]? --     Constitutional: Alert and oriented.  Eyes: Conjunctivae are normal.  Mouth/Throat: Mucous membranes are moist. Neck: No stridor.  No meningeal signs.   Cardiovascular: Normal rate, regular rhythm. Good peripheral circulation. Grossly normal heart sounds. Respiratory: Normal respiratory effort.  No retractions. Gastrointestinal: Soft and nontender. No distention.  Musculoskeletal: No lower extremity tenderness nor edema. No gross deformities of extremities. Neurologic:  Normal speech and language. No gross focal neurologic deficits are appreciated.  Skin:  Skin is warm, dry and intact. Psychiatric: Mood and affect are normal. Speech and behavior are normal.  ____________________________________________  Procedures   ____________________________________________   INITIAL IMPRESSION / MDM / ASSESSMENT AND PLAN / ED COURSE  As part of my medical decision making, I reviewed the following data within the electronic MEDICAL RECORD NUMBER  38 year old female presenting with above-stated history and physical exam secondary to request for medical clearance for incarceration by the jail.  Patient has no symptoms and was recently tested 1 week ago for COVID-19 as such testing not warranted at this time.     ____________________________________________  FINAL CLINICAL IMPRESSION(S) / ED DIAGNOSES  Final diagnoses:  Medical clearance for incarceration     MEDICATIONS GIVEN DURING THIS VISIT:  Medications - No data to display   ED Discharge Orders    None      *Please note:  Brianna Phillips was evaluated in Emergency Department on 03/27/2019 for the symptoms described in the history of present illness. She was evaluated in the context of the global COVID-19 pandemic, which necessitated consideration that the patient might be at risk for  infection with the SARS-CoV-2 virus that causes COVID-19. Institutional protocols and algorithms that pertain to the evaluation of patients at risk for COVID-19 are in a state of rapid change based on information released by regulatory bodies including the CDC and federal and state organizations. These policies and algorithms were followed during the patient's care in the ED.  Some ED evaluations and interventions may be delayed as a result of limited staffing during the pandemic.*  Note:  This document was prepared using Dragon voice recognition software and may include unintentional dictation errors.   Gregor Hams, MD 03/27/19 [phone]

## 2019-03-27 NOTE — ED Notes (Addendum)
Pt to the er for a covid test before jail. Pt denies any fever, cough, SOB, or illness. Pt was recently swabbed through work a week ago and test was negative. Pt is in no distress.

## 2019-04-03 ENCOUNTER — Telehealth: Payer: Self-pay | Admitting: Family Medicine

## 2019-04-03 NOTE — Telephone Encounter (Signed)
Pt called and stated that she would like something called in for acid reflux. Pt states that the problem keeps getting worse. Please advise

## 2019-04-06 ENCOUNTER — Telehealth: Payer: Self-pay | Admitting: Emergency Medicine

## 2019-04-06 ENCOUNTER — Telehealth: Payer: Self-pay | Admitting: Family Medicine

## 2019-04-06 ENCOUNTER — Encounter: Payer: Self-pay | Admitting: Family Medicine

## 2019-04-06 DIAGNOSIS — Z8719 Personal history of other diseases of the digestive system: Secondary | ICD-10-CM

## 2019-04-06 DIAGNOSIS — K219 Gastro-esophageal reflux disease without esophagitis: Secondary | ICD-10-CM

## 2019-04-06 DIAGNOSIS — Z8711 Personal history of peptic ulcer disease: Secondary | ICD-10-CM

## 2019-04-06 MED ORDER — PANTOPRAZOLE SODIUM 40 MG PO TBEC: 40.0000 mg | DELAYED_RELEASE_TABLET | Freq: Every day | ORAL | 1 refills | Status: DC

## 2019-04-06 NOTE — Telephone Encounter (Signed)
Patient is calling to request medicine Protonix Patient discussed Protonix at the time of the visit. And it was never called in.  Patient is requesting Protonix for Stomach ulcers and stomach acid causing heart burn.  Please advise  Preferred Pharmacy [facility] [address] [place]

## 2019-04-06 NOTE — Telephone Encounter (Signed)
See my chart encounter.

## 2019-04-06 NOTE — Telephone Encounter (Signed)
Put in patient request for Protonix for [facility]

## 2019-04-06 NOTE — Telephone Encounter (Signed)
I do not see this medication on patient chart but she stated she spoke to you about a refill

## 2019-04-17 NOTE — Telephone Encounter (Signed)
Erroneous Entry  

## 2019-04-22 ENCOUNTER — Other Ambulatory Visit: Payer: Self-pay | Admitting: Obstetrics and Gynecology

## 2019-04-22 ENCOUNTER — Telehealth: Payer: Self-pay

## 2019-04-22 DIAGNOSIS — N921 Excessive and frequent menstruation with irregular cycle: Secondary | ICD-10-CM

## 2019-04-22 MED ORDER — MICROGESTIN 24 FE 1-20 MG-MCG PO TABS: 1.0000 | ORAL_TABLET | Freq: Every day | ORAL | 1 refills | Status: DC

## 2019-04-22 NOTE — Telephone Encounter (Signed)
[facility] [place], they have generic and brand name

## 2019-04-22 NOTE — Progress Notes (Signed)
[facility] [place] [facility]

## 2019-04-22 NOTE — Telephone Encounter (Signed)
Will you call different pharmacy chain to see if having the same issue? Haven't heard of this with other pts and sometimes it's the generic the particular pharm is trying to order. Thx.

## 2019-04-22 NOTE — Telephone Encounter (Signed)
Pls let pt know [facility] in stock. Just have her tell you which one. Thx

## 2019-04-22 NOTE — Telephone Encounter (Signed)
Pt went to pharmacy to get her [facility] RF, was told it is on back order and told to call her provider. I called pharmacy to see if there was something similar she could be given and was told no, everything similar to that is on back order per pharmacist. Please advise. Pt will be leaving out of town this evening/tomorrow. Pt did mention she has gained 10 lbs that she cannot loose. Could it be the [facility]?

## 2019-04-22 NOTE — Telephone Encounter (Signed)
Rx eRxd.  

## 2019-04-22 NOTE — Telephone Encounter (Signed)
[facility] [place] [facility] pt[facility]

## 2019-04-22 NOTE — Telephone Encounter (Signed)
Pt aware.

## 2019-07-01 ENCOUNTER — Encounter: Payer: Self-pay | Admitting: Family Medicine

## 2019-07-06 ENCOUNTER — Other Ambulatory Visit: Payer: Self-pay

## 2019-07-06 ENCOUNTER — Encounter: Payer: Self-pay | Admitting: Family Medicine

## 2019-07-06 ENCOUNTER — Ambulatory Visit: Payer: BC Managed Care – PPO | Admitting: Family Medicine

## 2019-07-06 VITALS — BP 122/76 | HR 92 | Temp 97.7°F | Resp 14 | Ht 62.0 in | Wt 129.8 lb

## 2019-07-06 DIAGNOSIS — F419 Anxiety disorder, unspecified: Secondary | ICD-10-CM

## 2019-07-06 DIAGNOSIS — J310 Chronic rhinitis: Secondary | ICD-10-CM | POA: Diagnosis not present

## 2019-07-06 DIAGNOSIS — F4329 Adjustment disorder with other symptoms: Secondary | ICD-10-CM

## 2019-07-06 MED ORDER — ESCITALOPRAM OXALATE 5 MG PO TABS: ORAL_TABLET | ORAL | 1 refills | Status: DC

## 2019-07-06 MED ORDER — BUSPIRONE HCL 7.5 MG PO TABS: ORAL_TABLET | ORAL | 1 refills | Status: DC

## 2019-07-06 MED ORDER — PREDNISONE 10 MG PO TABS: ORAL_TABLET | ORAL | 0 refills | Status: DC

## 2019-07-06 NOTE — Patient Instructions (Signed)
-   Pscyhologytoday.com therapist finder - Headspace App - Meditation Podcasts  Here are some resources to help you if you feel you are in a mental health crisis:  [place] - Call [phone]  for help - Website with more resources: GripTrip.com.pt  [employer] - Call [phone] for help. - Mobile Crisis Program available 24 hours a day, 365 days a year. - Available for anyone of any age in [place] counties.  [facility] - Address: [address], [place] - Telephone: [phone]  - Hours of Operation: Sunday - Saturday - 8:00 a.m. - 8:00 p.m. - Medicaid, Medicare (Government Issued Only), BCBS, and [place] [employer], [place], Psychiatrists on-site to provide medication management, [place], and Peer Support Care.  National Mobile Crisis: [phone] - [place] available 24 hours a day, 365 days a year. - Available for anyone of any age in [place]
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
## 2019-07-12 ENCOUNTER — Other Ambulatory Visit: Payer: Self-pay | Admitting: Family Medicine

## 2019-07-12 DIAGNOSIS — K219 Gastro-esophageal reflux disease without esophagitis: Secondary | ICD-10-CM

## 2019-07-12 DIAGNOSIS — Z8711 Personal history of peptic ulcer disease: Secondary | ICD-10-CM

## 2019-07-12 DIAGNOSIS — Z8719 Personal history of other diseases of the digestive system: Secondary | ICD-10-CM

## 2019-07-15 ENCOUNTER — Ambulatory Visit: Payer: BC Managed Care – PPO | Admitting: Family Medicine

## 2019-07-24 ENCOUNTER — Encounter: Payer: Self-pay | Admitting: Family Medicine

## 2019-07-27 ENCOUNTER — Encounter: Payer: Self-pay | Admitting: Family Medicine

## 2019-08-03 ENCOUNTER — Ambulatory Visit: Payer: BC Managed Care – PPO | Admitting: Family Medicine

## 2019-08-18 ENCOUNTER — Encounter: Payer: Self-pay | Admitting: Family Medicine

## 2019-08-18 DIAGNOSIS — F4329 Adjustment disorder with other symptoms: Secondary | ICD-10-CM

## 2019-08-18 DIAGNOSIS — F419 Anxiety disorder, unspecified: Secondary | ICD-10-CM

## 2019-08-18 MED ORDER — ESCITALOPRAM OXALATE 5 MG PO TABS: 5.0000 mg | ORAL_TABLET | Freq: Every day | ORAL | 0 refills | Status: DC

## 2019-09-04 ENCOUNTER — Other Ambulatory Visit: Payer: Self-pay | Admitting: Family Medicine

## 2019-09-04 DIAGNOSIS — F4329 Adjustment disorder with other symptoms: Secondary | ICD-10-CM

## 2019-09-04 DIAGNOSIS — F419 Anxiety disorder, unspecified: Secondary | ICD-10-CM

## 2019-09-15 ENCOUNTER — Other Ambulatory Visit: Payer: Self-pay | Admitting: Family Medicine

## 2019-09-15 DIAGNOSIS — F419 Anxiety disorder, unspecified: Secondary | ICD-10-CM

## 2019-09-15 DIAGNOSIS — F4329 Adjustment disorder with other symptoms: Secondary | ICD-10-CM

## 2019-09-15 MED ORDER — BUSPIRONE HCL 7.5 MG PO TABS: ORAL_TABLET | ORAL | 1 refills | Status: DC

## 2019-09-15 NOTE — Telephone Encounter (Signed)
Medication Refill - Medication: busPIRone (BUSPAR) 7.5 MG tablet [id number]     Preferred Pharmacy (with phone number or street name):  [place], [address]  [place] [address]  Phone: [phone] Fax: [phone]  Not a 24 hour pharmacy; exact hours not known.     Agent: Please be advised that RX refills may take up to 3 business days. We ask that you follow-up with your pharmacy.

## 2019-10-13 ENCOUNTER — Other Ambulatory Visit: Payer: Self-pay | Admitting: Family Medicine

## 2019-10-13 DIAGNOSIS — F419 Anxiety disorder, unspecified: Secondary | ICD-10-CM

## 2019-10-13 DIAGNOSIS — F4329 Adjustment disorder with other symptoms: Secondary | ICD-10-CM

## 2019-10-13 NOTE — Telephone Encounter (Signed)
Requested medication (s) are due for refill today: yes  Requested medication (s) are on the active medication list: yes  Last refill:  09/04/19  Future visit scheduled: no  Notes to clinic:  please update rx xcript    Requested Prescriptions  Pending Prescriptions Disp Refills   escitalopram (LEXAPRO) 5 MG tablet 30 tablet 0    Sig: TAKE 1/2 TABLET BY MOUTH EVERY DAY FOR 7 DAYS. INCREASE TO 1 TABLET EVERY DAY      Psychiatry:  Antidepressants - SSRI Passed - 10/13/2019 12:45 PM      Passed - Valid encounter within last 6 months    Recent Outpatient Visits           3 months ago [place], [place], FNP   9 months ago Well woman exam with routine gynecological exam   [place], [place]

## 2019-10-13 NOTE — Telephone Encounter (Signed)
Medication Refill - Medication: escitalopram (LEXAPRO) 5 MG tablet    Has the patient contacted their pharmacy? No. (Agent: If no, request that the patient contact the pharmacy for the refill.) (Agent: If yes, when and what did the pharmacy advise?)  Preferred Pharmacy (with phone number or street name):  [place], [place] Phone:  [phone]  Fax:  [phone]       Agent: Please be advised that RX refills may take up to 3 business days. We ask that you follow-up with your pharmacy.

## 2019-10-14 NOTE — Telephone Encounter (Signed)
Patient scheduled with Dr. Ancil Boozer for  11/16/2019. Patient would like the Rx sent in today as soon as possible stating she's completely out.

## 2019-10-14 NOTE — Telephone Encounter (Signed)
lvm for scheduling °

## 2019-10-16 MED ORDER — ESCITALOPRAM OXALATE 5 MG PO TABS: ORAL_TABLET | ORAL | 0 refills | Status: DC

## 2019-11-11 ENCOUNTER — Other Ambulatory Visit: Payer: Self-pay | Admitting: Family Medicine

## 2019-11-11 ENCOUNTER — Telehealth: Payer: Self-pay | Admitting: Family Medicine

## 2019-11-11 ENCOUNTER — Other Ambulatory Visit: Payer: Self-pay | Admitting: Emergency Medicine

## 2019-11-11 DIAGNOSIS — F4329 Adjustment disorder with other symptoms: Secondary | ICD-10-CM

## 2019-11-11 DIAGNOSIS — F419 Anxiety disorder, unspecified: Secondary | ICD-10-CM

## 2019-11-11 MED ORDER — BUSPIRONE HCL 7.5 MG PO TABS: ORAL_TABLET | ORAL | 0 refills | Status: DC

## 2019-11-11 NOTE — Telephone Encounter (Signed)
busPIRone (BUSPAR) 7.5 MG tablet  Pt has called in very upset stating that she has been trying to get a refill on this med, must see the dr first as it has been relayed and has appt on Monday. States she can not wait till Monday is having withdrawal symptoms.. Cold chills, Body aches terribly and flu like symptoms. States that she has read it is from medication and not covid. Pt wants a dr to contact her and is quite agitated. FU CB at [phone]

## 2019-11-11 NOTE — Telephone Encounter (Signed)
Left message with Brianna Phillips that we may give her enough to last until her appointment on 4/12. Medication pend in chart

## 2019-11-16 ENCOUNTER — Ambulatory Visit (INDEPENDENT_AMBULATORY_CARE_PROVIDER_SITE_OTHER): Payer: BC Managed Care – PPO | Admitting: Family Medicine

## 2019-11-16 ENCOUNTER — Encounter: Payer: Self-pay | Admitting: Family Medicine

## 2019-11-16 VITALS — Ht 62.0 in | Wt 127.0 lb

## 2019-11-16 DIAGNOSIS — Z8711 Personal history of peptic ulcer disease: Secondary | ICD-10-CM

## 2019-11-16 DIAGNOSIS — K219 Gastro-esophageal reflux disease without esophagitis: Secondary | ICD-10-CM

## 2019-11-16 DIAGNOSIS — F419 Anxiety disorder, unspecified: Secondary | ICD-10-CM

## 2019-11-16 DIAGNOSIS — Z8719 Personal history of other diseases of the digestive system: Secondary | ICD-10-CM

## 2019-11-16 MED ORDER — ESCITALOPRAM OXALATE 10 MG PO TABS: 10.0000 mg | ORAL_TABLET | Freq: Every day | ORAL | 0 refills | Status: DC

## 2019-11-16 MED ORDER — BUSPIRONE HCL 15 MG PO TABS: 15.0000 mg | ORAL_TABLET | Freq: Two times a day (BID) | ORAL | 2 refills | Status: DC

## 2019-11-16 MED ORDER — PANTOPRAZOLE SODIUM 40 MG PO TBEC: 40.0000 mg | DELAYED_RELEASE_TABLET | Freq: Every day | ORAL | 1 refills | Status: DC

## 2019-11-16 NOTE — Progress Notes (Signed)
Name: Brianna Phillips   MRN: [id number]    DOB: March 26, 1981   Date:11/16/2019       Progress Note  Subjective  Chief Complaint  Chief Complaint  Patient presents with  . Medication Refill  . Anxiety    I connected with  Brianna Phillips on 11/16/19 at  3:00 PM EDT by telephone and verified that I am speaking with the correct person using two identifiers. vacy concerns of performing an evaluation and management service by telephone and the av I discussed the limitations, risks, security and priailability of in person appointments. Staff also discussed with the patient that there may be a patient responsible charge related to this service. Patient Location: at a store, but by herself Provider Location: [place]   HPI  Anxiety: she is doing better on Lexapro 5 mg and Buspar. She states medication has really improved her symptoms. She still gets frustrated but able to walk away when she realizes is something she cannot control. She also states stress at work is still very high . We discussed trying going up on lexapro and Buspar dose but see if she can try taking Buspar prn instead of BID.   History of gastric ulcer and GERD; she used to take pantoprazole prn but noticed increase in GERD symptoms and epigastric discomfort, she is back on daily medication, but denies vomiting, weight loss, change in bowel movements or blood in stools. We reviewed a GERD appropriate diet, avoid skipping meals and call back if any red flags for referral to GI    Patient Active Problem List   Diagnosis Date Noted  . Chronic rhinitis 01/14/2019  . Dysmenorrhea 01/14/2019  . Menorrhagia with regular cycle 01/14/2019  . Fatigue 01/14/2019  . Stomach ulcer 01/13/2019  . Family history of breast cancer 01/13/2019    Past Surgical History:  Procedure Laterality Date  . DILATION AND CURETTAGE OF UTERUS    . FINGER SURGERY    . FRACTURE SURGERY     Left Wrist  . I & D EXTREMITY  01/23/2012   Procedure:  IRRIGATION AND DEBRIDEMENT EXTREMITY;  Surgeon: Linna Hoff, MD;  Location: [facility];  Service: Orthopedics;  Laterality: Left;  Left Index Finger  . joint arthorplasty Left    left wrist temporaomandibular    Family History  Problem Relation Age of Onset  . Breast cancer Maternal Grandmother        young  . Breast cancer Maternal Aunt        young  . Ovarian cancer Maternal Aunt     Social History   Tobacco Use  . Smoking status: Former Research scientist (life sciences)  . Smokeless tobacco: Never Used  Substance Use Topics  . Alcohol use: Yes    Comment: Ocassional    Current Outpatient Medications:  .  APPLE CIDER VINEGAR PO, Take by mouth., Disp: , Rfl:  .  Azelastine-Fluticasone (DYMISTA) 137-50 MCG/ACT SUSP, Place 1 spray into the nose 2 (two) times daily., Disp: 23 g, Rfl: 3 .  busPIRone (BUSPAR) 15 MG tablet, Take 1 tablet (15 mg total) by mouth 2 (two) times daily., Disp: 60 tablet, Rfl: 2 .  cholecalciferol (VITAMIN D3) 25 MCG (1000 UNIT) tablet, Take 1,000 Units by mouth daily., Disp: , Rfl:  .  escitalopram (LEXAPRO) 10 MG tablet, Take 1 tablet (10 mg total) by mouth daily. One daily, Disp: 90 tablet, Rfl: 0 .  levocetirizine (XYZAL) 5 MG tablet, Take 5 mg by mouth every evening., Disp: , Rfl:  .  pantoprazole (PROTONIX) 40 MG tablet, Take 1 tablet (40 mg total) by mouth daily., Disp: 90 tablet, Rfl: 1 .  Probiotic Product (CULTURELLE PROBIOTICS PO), Take by mouth., Disp: , Rfl:  .  OVER THE COUNTER MEDICATION, Take 1 capsule by mouth at bedtime. OTC "Detox regimen", Disp: , Rfl:   Allergies  Allergen Reactions  . Penicillins Hives  . Other Swelling and Rash    Eggplant allergy    I personally reviewed active problem list, medication list, allergies, family history, social history, health maintenance with the patient/caregiver today.   ROS  Ten systems reviewed and is negative except as mentioned in HPI   Objective  Virtual encounter, vitals not obtained.  Body mass index is  23.23 kg/m.  Physical Exam  Awake, alert and oriented   PHQ2/9: Depression screen [facility] 2/9 11/16/2019 07/06/2019 01/13/2019  Decreased Interest 0 0 0  Down, Depressed, Hopeless 0 0 0  PHQ - 2 Score 0 0 0  Altered sleeping 1 0 0  Tired, decreased energy 2 0 0  Change in appetite 0 0 0  Feeling bad or failure about yourself  0 0 0  Trouble concentrating 0 0 0  Moving slowly or fidgety/restless 0 0 0  Suicidal thoughts 0 0 0  PHQ-9 Score 3 0 0  Difficult doing work/chores Not difficult at all Not difficult at all -   PHQ-2/9 Result is negative.    Fall Risk: Fall Risk  11/16/2019 07/06/2019 01/13/2019  Falls in the past year? 0 0 0  Number falls in past yr: 0 0 0  Injury with Fall? 0 0 0  Follow up - Falls evaluation completed -    GAD 7 : Generalized Anxiety Score 11/16/2019 07/06/2019  Nervous, Anxious, on Edge 1 3  Control/stop worrying 1 1  Worry too much - different things 3 3  Trouble relaxing 0 3  Restless 0 0  Easily annoyed or irritable 1 3  Afraid - awful might happen 0 0  Total GAD 7 Score 6 13  Anxiety Difficulty Somewhat difficult Very difficult     Assessment & Plan  1. Anxiety  - busPIRone (BUSPAR) 15 MG tablet; Take 1 tablet (15 mg total) by mouth 2 (two) times daily.  Dispense: 60 tablet; Refill: 2 - escitalopram (LEXAPRO) 10 MG tablet; Take 1 tablet (10 mg total) by mouth daily. One daily  Dispense: 90 tablet; Refill: 0   2. Gastroesophageal reflux disease without esophagitis  - pantoprazole (PROTONIX) 40 MG tablet; Take 1 tablet (40 mg total) by mouth daily.  Dispense: 90 tablet; Refill: 1   3. History of stomach ulcers  - pantoprazole (PROTONIX) 40 MG tablet; Take 1 tablet (40 mg total) by mouth daily.  Dispense: 90 tablet; Refill: 1 I discussed the assessment and treatment plan with the patient. The patient was provided an opportunity to ask questions and all were answered. The patient agreed with the plan and demonstrated an understanding of the  instructions.   The patient was advised to call back or seek an in-person evaluation if the symptoms worsen or if the condition fails to improve as anticipated.  I provided 15  minutes of non-face-to-face time during this encounter.  Loistine Chance, MD

## 2020-01-13 ENCOUNTER — Other Ambulatory Visit: Payer: Self-pay

## 2020-01-13 DIAGNOSIS — J310 Chronic rhinitis: Secondary | ICD-10-CM

## 2020-01-13 MED ORDER — AZELASTINE-FLUTICASONE 137-50 MCG/ACT NA SUSP: 1.0000 | Freq: Two times a day (BID) | NASAL | 3 refills | Status: DC

## 2020-02-09 ENCOUNTER — Ambulatory Visit: Payer: Self-pay | Admitting: Family Medicine

## 2020-03-09 ENCOUNTER — Other Ambulatory Visit: Payer: Self-pay | Admitting: Family Medicine

## 2020-03-09 DIAGNOSIS — F419 Anxiety disorder, unspecified: Secondary | ICD-10-CM

## 2020-05-23 ENCOUNTER — Other Ambulatory Visit: Payer: Self-pay | Admitting: Family Medicine

## 2020-05-23 DIAGNOSIS — F419 Anxiety disorder, unspecified: Secondary | ICD-10-CM

## 2020-06-20 ENCOUNTER — Other Ambulatory Visit: Payer: Self-pay | Admitting: Family Medicine

## 2020-06-20 DIAGNOSIS — F419 Anxiety disorder, unspecified: Secondary | ICD-10-CM

## 2020-06-20 NOTE — Telephone Encounter (Signed)
Called patient to schedule appt. No answer, left message to call clinic back to schedule appt.

## 2020-07-27 ENCOUNTER — Other Ambulatory Visit: Payer: Self-pay | Admitting: Family Medicine

## 2020-07-27 DIAGNOSIS — F419 Anxiety disorder, unspecified: Secondary | ICD-10-CM

## 2020-07-27 NOTE — Telephone Encounter (Signed)
Requested medications are due for refill today yes  Requested medications are on the active medication list yes  Last refill 10/18 for Buspar and 11/16 for Lexapro  Last visit 11/2019  Future visit scheduled No  Notes to clinic Has already had a curtesy refill and there is no upcoming appointment scheduled.

## 2020-07-28 ENCOUNTER — Other Ambulatory Visit: Payer: Self-pay

## 2020-08-03 ENCOUNTER — Telehealth: Payer: Self-pay

## 2020-08-03 DIAGNOSIS — K219 Gastro-esophageal reflux disease without esophagitis: Secondary | ICD-10-CM

## 2020-08-03 DIAGNOSIS — F419 Anxiety disorder, unspecified: Secondary | ICD-10-CM

## 2020-08-03 DIAGNOSIS — Z8711 Personal history of peptic ulcer disease: Secondary | ICD-10-CM

## 2020-08-03 MED ORDER — ESCITALOPRAM OXALATE 10 MG PO TABS: 10.0000 mg | ORAL_TABLET | Freq: Every day | ORAL | 0 refills | Status: DC

## 2020-08-03 MED ORDER — BUSPIRONE HCL 15 MG PO TABS: 15.0000 mg | ORAL_TABLET | Freq: Two times a day (BID) | ORAL | 0 refills | Status: DC | PRN

## 2020-08-03 MED ORDER — PANTOPRAZOLE SODIUM 40 MG PO TBEC: 40.0000 mg | DELAYED_RELEASE_TABLET | Freq: Every day | ORAL | 0 refills | Status: DC

## 2020-08-03 NOTE — Telephone Encounter (Signed)
Copied from CRM [id number]. Topic: General - Other >> Aug 03, 2020 10:15 AM Dalphine Handing A wrote: Patient wants to know if there is anyway her protonix and anxiety medication can be sent into pharmacy by another provider while Dr. Carlynn Purl is out of office. Please Francee Piccolo

## 2020-08-03 NOTE — Telephone Encounter (Signed)
Patient last seen in April. Is she ok to wait til Monday

## 2020-08-03 NOTE — Telephone Encounter (Signed)
I called the pt and reviewed her meds and dosing prior to putting in a 30 d courtesy refill.  Lexapro med dose in chart was 10 mg, per last OV note was 5 mg - pt found her bottle and confirmed she is taking 10 mg    She notes severe reflux and having to have protonix  Meds refilled lexapro, buspar and protonix  Instructed to keep appt in Jan with Dr. Carlynn Purl for her f/up, but this way she will not run out before then  Danelle Berry, PA-C

## 2020-08-04 ENCOUNTER — Other Ambulatory Visit: Payer: Self-pay

## 2020-08-04 DIAGNOSIS — F419 Anxiety disorder, unspecified: Secondary | ICD-10-CM

## 2020-08-04 MED ORDER — BUSPIRONE HCL 15 MG PO TABS: 15.0000 mg | ORAL_TABLET | Freq: Two times a day (BID) | ORAL | 0 refills | Status: DC | PRN

## 2020-08-04 MED ORDER — ESCITALOPRAM OXALATE 10 MG PO TABS: 10.0000 mg | ORAL_TABLET | Freq: Every day | ORAL | 0 refills | Status: DC

## 2020-08-25 ENCOUNTER — Telehealth: Payer: Self-pay

## 2020-08-25 NOTE — Telephone Encounter (Signed)
Spoke with pt and offered her our first availability, which was 1.21.2022 with Dr Ancil Boozer. She asked what about the weather tomorrow, how am I suppose to pick up meds if we have bad weather. I informed her that this is the only appointment we had available. Pt hung up. Copied from [place] [phone]. Topic: General - Other >> Aug 25, 2020 11:48 AM Brianna Phillips A wrote: Reason for CRM: Patient has recently tested positive for COVID via an at-home test. Patient is inquiring about potential recommendations as well as an inhaler possibly.

## 2020-08-30 ENCOUNTER — Ambulatory Visit: Payer: Self-pay | Admitting: Family Medicine

## 2020-10-21 ENCOUNTER — Other Ambulatory Visit: Payer: Self-pay | Admitting: Family Medicine

## 2020-10-21 DIAGNOSIS — F419 Anxiety disorder, unspecified: Secondary | ICD-10-CM

## 2020-11-22 ENCOUNTER — Telehealth: Payer: Self-pay | Admitting: Family Medicine

## 2020-11-22 ENCOUNTER — Telehealth (INDEPENDENT_AMBULATORY_CARE_PROVIDER_SITE_OTHER): Payer: Self-pay | Admitting: Family Medicine

## 2020-11-22 ENCOUNTER — Ambulatory Visit: Payer: Self-pay | Admitting: Family Medicine

## 2020-11-22 DIAGNOSIS — Z5329 Procedure and treatment not carried out because of patient's decision for other reasons: Secondary | ICD-10-CM

## 2020-11-22 NOTE — Progress Notes (Signed)
No show

## 2020-11-24 ENCOUNTER — Ambulatory Visit: Payer: Self-pay | Admitting: Family Medicine

## 2020-12-01 ENCOUNTER — Ambulatory Visit: Payer: Self-pay | Admitting: Family Medicine

## 2020-12-02 ENCOUNTER — Other Ambulatory Visit: Payer: Self-pay | Admitting: Family Medicine

## 2020-12-02 DIAGNOSIS — F419 Anxiety disorder, unspecified: Secondary | ICD-10-CM

## 2020-12-02 NOTE — Progress Notes (Deleted)
Name: Brianna Phillips   MRN: [id number]    DOB: [birth year]   Date:12/02/2020       Progress Note  Subjective  Chief Complaint  Follow Up  HPI  Anxiety: she is doing better on Lexapro 5 mg and Buspar. She states medication has really improved her symptoms. She still gets frustrated but able to walk away when she realizes is something she cannot control. She also states stress at work is still very high . We discussed trying going up on lexapro and Buspar dose but see if she can try taking Buspar prn instead of BID.   History of gastric ulcer and GERD; she used to take pantoprazole prn but noticed increase in GERD symptoms and epigastric discomfort, she is back on daily medication, but denies vomiting, weight loss, change in bowel movements or blood in stools. We reviewed a GERD appropriate diet, avoid skipping meals and call back if any red flags for referral to GI  Patient Active Problem List   Diagnosis Date Noted  . Chronic rhinitis 01/14/2019  . Dysmenorrhea 01/14/2019  . Menorrhagia with regular cycle 01/14/2019  . Fatigue 01/14/2019  . Stomach ulcer 01/13/2019  . Family history of breast cancer 01/13/2019    Past Surgical History:  Procedure Laterality Date  . DILATION AND CURETTAGE OF UTERUS    . FINGER SURGERY    . FRACTURE SURGERY     Left Wrist  . I & D EXTREMITY  01/23/2012   Procedure: IRRIGATION AND DEBRIDEMENT EXTREMITY;  Surgeon: Linna Hoff, MD;  Location: [facility];  Service: Orthopedics;  Laterality: Left;  Left Index Finger  . joint arthorplasty Left    left wrist temporaomandibular    Family History  Problem Relation Age of Onset  . Breast cancer Maternal Grandmother        young  . Breast cancer Maternal Aunt        young  . Ovarian cancer Maternal Aunt     Social History   Tobacco Use  . Smoking status: Former Research scientist (life sciences)  . Smokeless tobacco: Never Used  Substance Use Topics  . Alcohol use: Yes    Comment: Ocassional     Current Outpatient  Medications:  .  APPLE CIDER VINEGAR PO, Take by mouth., Disp: , Rfl:  .  Azelastine-Fluticasone (DYMISTA) 137-50 MCG/ACT SUSP, Place 1 spray into the nose 2 (two) times daily., Disp: 23 g, Rfl: 3 .  busPIRone (BUSPAR) 15 MG tablet, Take 1 tablet (15 mg total) by mouth 2 (two) times daily as needed., Disp: 60 tablet, Rfl: 0 .  cholecalciferol (VITAMIN D3) 25 MCG (1000 UNIT) tablet, Take 1,000 Units by mouth daily., Disp: , Rfl:  .  escitalopram (LEXAPRO) 10 MG tablet, TAKE ONE TABLET BY MOUTH DAILY, Disp: 30 tablet, Rfl: 0 .  levocetirizine (XYZAL) 5 MG tablet, Take 5 mg by mouth every evening., Disp: , Rfl:  .  OVER THE COUNTER MEDICATION, Take 1 capsule by mouth at bedtime. OTC "Detox regimen", Disp: , Rfl:  .  pantoprazole (PROTONIX) 40 MG tablet, Take 1 tablet (40 mg total) by mouth daily., Disp: 90 tablet, Rfl: 0 .  Probiotic Product (CULTURELLE PROBIOTICS PO), Take by mouth., Disp: , Rfl:   Allergies  Allergen Reactions  . Penicillins Hives  . Other Swelling and Rash    Eggplant allergy    I personally reviewed {Reviewed:14835} with the patient/caregiver today.   ROS  ***  Objective  There were no vitals filed for this visit.  There is no height or weight on file to calculate BMI.  Physical Exam ***  No results found for this or any previous visit (from the past 2160 hour(s)).  Diabetic Foot Exam: Diabetic Foot Exam - Simple   No data filed    ***  PHQ2/9: Depression screen [facility] 2/9 11/16/2019 07/06/2019 01/13/2019  Decreased Interest 0 0 0  Down, Depressed, Hopeless 0 0 0  PHQ - 2 Score 0 0 0  Altered sleeping 1 0 0  Tired, decreased energy 2 0 0  Change in appetite 0 0 0  Feeling bad or failure about yourself  0 0 0  Trouble concentrating 0 0 0  Moving slowly or fidgety/restless 0 0 0  Suicidal thoughts 0 0 0  PHQ-9 Score 3 0 0  Difficult doing work/chores Not difficult at all Not difficult at all -    phq 9 is {gen pos [id number]} ***  Fall  Risk: Fall Risk  11/16/2019 07/06/2019 01/13/2019  Falls in the past year? 0 0 0  Number falls in past yr: 0 0 0  Injury with Fall? 0 0 0  Follow up - Falls evaluation completed -   ***   Functional Status Survey:   ***   Assessment & Plan  *** There are no diagnoses linked to this encounter.

## 2020-12-02 NOTE — Telephone Encounter (Signed)
P has appt on 12-05-2020 with [facility]

## 2020-12-02 NOTE — Telephone Encounter (Signed)
Requested medication (s) are due for refill today: Yes  Requested medication (s) are on the active medication list: Yes  Last refill:  10/21/20  Future visit scheduled: Yes. Pt. Changed last appointment.  Notes to clinic:  See request.    Requested Prescriptions  Pending Prescriptions Disp Refills   escitalopram (LEXAPRO) 10 MG tablet [Pharmacy Med Name: ESCITALOPRAM 10 MG TABLET] 30 tablet 0    Sig: TAKE ONE TABLET BY MOUTH DAILY      Psychiatry:  Antidepressants - SSRI Failed - 12/02/2020 11:02 AM      Failed - Valid encounter within last 6 months    Recent Outpatient Visits           1 week ago No-show for appointment   [facility] Steele Sizer, MD   1 year ago Gastroesophageal reflux disease without esophagitis   [facility] Steele Sizer, MD   1 year ago [place], [place], FNP   1 year ago Well woman exam with routine gynecological exam   [place], FNP       Future Appointments             In 3 days Steele Sizer, MD [facility], [facility]

## 2020-12-05 ENCOUNTER — Ambulatory Visit: Payer: Self-pay | Admitting: Family Medicine

## 2020-12-05 NOTE — Progress Notes (Signed)
Name: Brianna Phillips   MRN: [id number]    DOB: 03-Dec-1980   Date:12/07/2020       Progress Note  Subjective  Chief Complaint  Follow Up  HPI  Anxiety: she is doing better on Lexapro 5 mg and Buspar. She states medication has really improved her symptoms. She still gets frustrated but able to walk away when she realizes is something she cannot control. She is under more stress but states medication is controlling symptoms and she needs refills today. No side effects   History of gastric ulcer and GERD: she was doing well, however last week symptoms returned, with epigastric sharp pain, worse when she was fasting, she states even banana was making it worse. She states she resumed taking pantoprazole daily, resumed a very bland diet and symptoms are improving. She denies  vomiting, weight loss, change in bowel movements or blood in stools. She cannot afford going back to GI at this time  Weight gain: she states currently only working part time, not as active at her job and she thinks that is why she gained weight, she is eating later at night and snacking more than usual . Offered to check labs but she cannot afford it at this time  AR: controlled with Dymista, she denies any sneezing or nasal congestion, she states most of her symptoms is from eustachian tube dysfunction, she has a mild post-nasal drainage and slight cough when that happens   Patient Active Problem List   Diagnosis Date Noted  . Chronic rhinitis 01/14/2019  . Dysmenorrhea 01/14/2019  . Menorrhagia with regular cycle 01/14/2019  . Fatigue 01/14/2019  . Stomach ulcer 01/13/2019  . Family history of breast cancer 01/13/2019    Past Surgical History:  Procedure Laterality Date  . DILATION AND CURETTAGE OF UTERUS    . FINGER SURGERY    . FRACTURE SURGERY     Left Wrist  . I & D EXTREMITY  01/23/2012   Procedure: IRRIGATION AND DEBRIDEMENT EXTREMITY;  Surgeon: Linna Hoff, MD;  Location: [place];  Service: Orthopedics;   Laterality: Left;  Left Index Finger  . joint arthorplasty Left    left wrist temporaomandibular    Family History  Problem Relation Age of Onset  . Breast cancer Maternal Grandmother        young  . Breast cancer Maternal Aunt        young  . Ovarian cancer Maternal Aunt     Social History   Tobacco Use  . Smoking status: Former Research scientist (life sciences)  . Smokeless tobacco: Never Used  Substance Use Topics  . Alcohol use: Yes    Comment: Ocassional     Current Outpatient Medications:  .  Azelastine-Fluticasone (DYMISTA) 137-50 MCG/ACT SUSP, Place 1 spray into the nose 2 (two) times daily., Disp: 23 g, Rfl: 3 .  busPIRone (BUSPAR) 15 MG tablet, Take 1 tablet (15 mg total) by mouth 2 (two) times daily as needed., Disp: 60 tablet, Rfl: 0 .  cholecalciferol (VITAMIN D3) 25 MCG (1000 UNIT) tablet, Take 1,000 Units by mouth daily., Disp: , Rfl:  .  escitalopram (LEXAPRO) 10 MG tablet, TAKE ONE TABLET BY MOUTH DAILY, Disp: 30 tablet, Rfl: 0 .  levocetirizine (XYZAL) 5 MG tablet, Take 5 mg by mouth every evening., Disp: , Rfl:  .  pantoprazole (PROTONIX) 40 MG tablet, Take 1 tablet (40 mg total) by mouth daily., Disp: 90 tablet, Rfl: 0 .  Probiotic Product (CULTURELLE PROBIOTICS PO), Take by mouth., Disp: ,  Rfl:   Allergies  Allergen Reactions  . Penicillins Hives  . Other Swelling and Rash    Eggplant allergy    I personally reviewed active problem list, medication list, allergies, family history, social history, health maintenance with the patient/caregiver today.   ROS  Constitutional: Negative for fever, positive for  weight change.  Respiratory: Negative for cough and shortness of breath.   Cardiovascular: Negative for chest pain or palpitations.  Gastrointestinal: positive  for abdominal pain, but no  bowel changes.  Musculoskeletal: Negative for gait problem or joint swelling.  Skin: Negative for rash.  Neurological: Negative for dizziness or headache.  No other specific  complaints in a complete review of systems (except as listed in HPI above).  Objective  Vitals:   12/07/20 1012  BP: 124/64  Pulse: 85  Resp: 16  Temp: 98 F (36.7 C)  SpO2: 99%  Weight: 146 lb 9.6 oz (66.5 kg)  Height: 5\' 2"  (1.575 m)    Body mass index is 26.81 kg/m.  Physical Exam  Constitutional: Patient appears well-developed and well-nourished. Overweight. No distress.  HEENT: head atraumatic, normocephalic, pupils equal and reactive to light, ears normal TM bilaterally  neck supple, throat within normal limits Cardiovascular: Normal rate, regular rhythm and normal heart sounds.  No murmur heard. No BLE edema. Pulmonary/Chest: Effort normal and breath sounds normal. No respiratory distress. Abdominal: Soft.  There is epigastric tenderness. Psychiatric: Patient has a normal mood and affect. behavior is normal. Judgment and thought content normal.  PHQ2/9: Depression screen [facility] 2/9 12/07/2020 11/16/2019 07/06/2019 01/13/2019  Decreased Interest 0 0 0 0  Down, Depressed, Hopeless 0 0 0 0  PHQ - 2 Score 0 0 0 0  Altered sleeping 2 1 0 0  Tired, decreased energy 2 2 0 0  Change in appetite 0 0 0 0  Feeling bad or failure about yourself  0 0 0 0  Trouble concentrating 0 0 0 0  Moving slowly or fidgety/restless 0 0 0 0  Suicidal thoughts 0 0 0 0  PHQ-9 Score 4 3 0 0  Difficult doing work/chores Not difficult at all Not difficult at all Not difficult at all -    phq 9 is positive and negative   Fall Risk: Fall Risk  12/07/2020 11/16/2019 07/06/2019 01/13/2019  Falls in the past year? 0 0 0 0  Number falls in past yr: 0 0 0 0  Injury with Fall? 0 0 0 0  Follow up - - Falls evaluation completed -    Functional Status Survey: Is the patient deaf or have difficulty hearing?: No Does the patient have difficulty seeing, even when wearing glasses/contacts?: No Does the patient have difficulty concentrating, remembering, or making decisions?: No Does the patient have difficulty  walking or climbing stairs?: No Does the patient have difficulty dressing or bathing?: No Does the patient have difficulty doing errands alone such as visiting a doctor's office or shopping?: No    Assessment & Plan  1. Chronic rhinitis  - Azelastine-Fluticasone (DYMISTA) 137-50 MCG/ACT SUSP; Place 1 spray into the nose 2 (two) times daily.  Dispense: 23 g; Refill: 3  2. Gastroesophageal reflux disease without esophagitis  - pantoprazole (PROTONIX) 40 MG tablet; Take 1 tablet (40 mg total) by mouth daily.  Dispense: 90 tablet; Refill: 1  3. Anxiety  - busPIRone (BUSPAR) 15 MG tablet; Take 1 tablet (15 mg total) by mouth 2 (two) times daily as needed.  Dispense: 180 tablet; Refill: 1 - escitalopram (  LEXAPRO) 10 MG tablet; Take 1 tablet (10 mg total) by mouth daily.  Dispense: 90 tablet; Refill: 1  4. History of stomach ulcers  - pantoprazole (PROTONIX) 40 MG tablet; Take 1 tablet (40 mg total) by mouth daily.  Dispense: 90 tablet; Refill: 1  5. Dysfunction of both eustachian tubes  Stable on medication

## 2020-12-07 ENCOUNTER — Other Ambulatory Visit: Payer: Self-pay

## 2020-12-07 ENCOUNTER — Encounter: Payer: Self-pay | Admitting: Family Medicine

## 2020-12-07 ENCOUNTER — Ambulatory Visit (INDEPENDENT_AMBULATORY_CARE_PROVIDER_SITE_OTHER): Payer: Self-pay | Admitting: Family Medicine

## 2020-12-07 VITALS — BP 124/64 | HR 85 | Temp 98.0°F | Resp 16 | Ht 62.0 in | Wt 146.6 lb

## 2020-12-07 DIAGNOSIS — H6983 Other specified disorders of Eustachian tube, bilateral: Secondary | ICD-10-CM

## 2020-12-07 DIAGNOSIS — Z8711 Personal history of peptic ulcer disease: Secondary | ICD-10-CM

## 2020-12-07 DIAGNOSIS — J310 Chronic rhinitis: Secondary | ICD-10-CM

## 2020-12-07 DIAGNOSIS — F419 Anxiety disorder, unspecified: Secondary | ICD-10-CM

## 2020-12-07 DIAGNOSIS — K219 Gastro-esophageal reflux disease without esophagitis: Secondary | ICD-10-CM

## 2020-12-07 MED ORDER — BUSPIRONE HCL 15 MG PO TABS: 15.0000 mg | ORAL_TABLET | Freq: Two times a day (BID) | ORAL | 1 refills | Status: DC | PRN

## 2020-12-07 MED ORDER — PANTOPRAZOLE SODIUM 40 MG PO TBEC: 40.0000 mg | DELAYED_RELEASE_TABLET | Freq: Every day | ORAL | 1 refills | Status: DC

## 2020-12-07 MED ORDER — AZELASTINE-FLUTICASONE 137-50 MCG/ACT NA SUSP: 1.0000 | Freq: Two times a day (BID) | NASAL | 3 refills | Status: DC

## 2020-12-07 MED ORDER — ESCITALOPRAM OXALATE 10 MG PO TABS: 1.0000 | ORAL_TABLET | Freq: Every day | ORAL | 1 refills | Status: DC

## 2021-01-25 ENCOUNTER — Other Ambulatory Visit: Payer: Self-pay

## 2021-01-25 ENCOUNTER — Encounter: Payer: Self-pay | Admitting: Emergency Medicine

## 2021-01-25 ENCOUNTER — Emergency Department
Admission: EM | Admit: 2021-01-25 | Discharge: 2021-01-25 | Disposition: A | Discharge status: Home / Self Care | Payer: BC Managed Care – PPO | Attending: Emergency Medicine | Admitting: Emergency Medicine

## 2021-01-25 DIAGNOSIS — Z8543 Personal history of malignant neoplasm of ovary: Secondary | ICD-10-CM | POA: Insufficient documentation

## 2021-01-25 DIAGNOSIS — T23259A Burn of second degree of unspecified palm, initial encounter: Secondary | ICD-10-CM

## 2021-01-25 DIAGNOSIS — T23202A Burn of second degree of left hand, unspecified site, initial encounter: Secondary | ICD-10-CM | POA: Insufficient documentation

## 2021-01-25 DIAGNOSIS — Y92009 Unspecified place in unspecified non-institutional (private) residence as the place of occurrence of the external cause: Secondary | ICD-10-CM | POA: Insufficient documentation

## 2021-01-25 DIAGNOSIS — Z87891 Personal history of nicotine dependence: Secondary | ICD-10-CM | POA: Insufficient documentation

## 2021-01-25 DIAGNOSIS — X18XXXA Contact with other hot metals, initial encounter: Secondary | ICD-10-CM | POA: Insufficient documentation

## 2021-01-25 DIAGNOSIS — Z96632 Presence of left artificial wrist joint: Secondary | ICD-10-CM | POA: Insufficient documentation

## 2021-01-25 MED ORDER — ONDANSETRON 4 MG PO TBDP
4.0000 mg | ORAL_TABLET | Freq: Once | ORAL | Status: AC
  Administered 2021-01-25: 4 mg via ORAL
  Filled 2021-01-25: qty 1

## 2021-01-25 MED ORDER — SILVER SULFADIAZINE 1 % EX CREA: TOPICAL_CREAM | CUTANEOUS | 1 refills | Status: DC

## 2021-01-25 MED ORDER — HYDROCODONE-ACETAMINOPHEN 5-325 MG PO TABS
1.0000 | ORAL_TABLET | Freq: Once | ORAL | Status: AC
  Administered 2021-01-25: 1 via ORAL
  Filled 2021-01-25: qty 1
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
## 2021-01-25 NOTE — ED Notes (Signed)
Dressing applied to left hand.
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
## 2021-01-25 NOTE — Discharge Instructions (Addendum)
You can take Tylenol and ibuprofen alternating for hand pain. Apply Silvadene to hand once daily before bed.

## 2021-01-25 NOTE — ED Notes (Signed)
Patient provided discharge instructions, including follow up care, prescriptions, and dressing instructions. Patient verbalized understanding. Patient provided work excuse by [place], [place]. Patient denies questions, or concerns regarding discharge. Patient ambulatory with steady gait. Patient respirations even and unlabored, NADN.

## 2021-08-15 DIAGNOSIS — M79645 Pain in left finger(s): Secondary | ICD-10-CM | POA: Diagnosis not present

## 2021-08-16 ENCOUNTER — Other Ambulatory Visit: Payer: Self-pay | Admitting: Orthopedic Surgery

## 2021-08-16 DIAGNOSIS — M79645 Pain in left finger(s): Secondary | ICD-10-CM

## 2021-08-24 ENCOUNTER — Ambulatory Visit: Admission: RE | Admit: 2021-08-24 | Payer: BC Managed Care – PPO | Source: Ambulatory Visit

## 2021-09-01 ENCOUNTER — Other Ambulatory Visit: Payer: Self-pay | Admitting: Family Medicine

## 2021-09-01 DIAGNOSIS — F419 Anxiety disorder, unspecified: Secondary | ICD-10-CM

## 2021-09-04 NOTE — Telephone Encounter (Signed)
Appt scheduled for 2.17.2023 with Dr Ancil Boozer

## 2021-09-21 NOTE — Progress Notes (Unsigned)
Name: Brianna Phillips   MRN: [id number]    DOB: January 12, 1981   Date:09/21/2021       Progress Note  Subjective  Chief Complaint  Medication Refill  HPI  Anxiety: she is doing better on Lexapro 5 mg and Buspar. She states medication has really improved her symptoms. She still gets frustrated but able to walk away when she realizes is something she cannot control. She is under more stress but states medication is controlling symptoms and she needs refills today. No side effects   History of gastric ulcer and GERD: she was doing well, however last week symptoms returned, with epigastric sharp pain, worse when she was fasting, she states even banana was making it worse. She states she resumed taking pantoprazole daily, resumed a very bland diet and symptoms are improving. She denies  vomiting, weight loss, change in bowel movements or blood in stools. She cannot afford going back to GI at this time  Weight gain: she states currently only working part time, not as active at her job and she thinks that is why she gained weight, she is eating later at night and snacking more than usual . Offered to check labs but she cannot afford it at this time  AR: controlled with Dymista, she denies any sneezing or nasal congestion, she states most of her symptoms is from eustachian tube dysfunction, she has a mild post-nasal drainage and slight cough when that happens   Patient Active Problem List   Diagnosis Date Noted   Chronic rhinitis 01/14/2019   Dysmenorrhea 01/14/2019   Menorrhagia with regular cycle 01/14/2019   Fatigue 01/14/2019   Stomach ulcer 01/13/2019   Family history of breast cancer 01/13/2019    Past Surgical History:  Procedure Laterality Date   DILATION AND CURETTAGE OF UTERUS     FINGER SURGERY     FRACTURE SURGERY     Left Wrist   I & D EXTREMITY  01/23/2012   Procedure: IRRIGATION AND DEBRIDEMENT EXTREMITY;  Surgeon: Linna Hoff, MD;  Location: [place];  Service: Orthopedics;   Laterality: Left;  Left Index Finger   joint arthorplasty Left    left wrist temporaomandibular    Family History  Problem Relation Age of Onset   Breast cancer Maternal Grandmother        young   Breast cancer Maternal Aunt        young   Ovarian cancer Maternal Aunt     Social History   Tobacco Use   Smoking status: Former   Smokeless tobacco: Never  Substance Use Topics   Alcohol use: Yes    Comment: Ocassional     Current Outpatient Medications:    Azelastine-Fluticasone (DYMISTA) 137-50 MCG/ACT SUSP, Place 1 spray into the nose 2 (two) times daily., Disp: 23 g, Rfl: 3   busPIRone (BUSPAR) 15 MG tablet, Take 1 tablet (15 mg total) by mouth 2 (two) times daily as needed., Disp: 180 tablet, Rfl: 1   cholecalciferol (VITAMIN D3) 25 MCG (1000 UNIT) tablet, Take 1,000 Units by mouth daily., Disp: , Rfl:    escitalopram (LEXAPRO) 10 MG tablet, TAKE ONE TABLET BY MOUTH DAILY, Disp: 30 tablet, Rfl: 0   levocetirizine (XYZAL) 5 MG tablet, Take 5 mg by mouth every evening., Disp: , Rfl:    pantoprazole (PROTONIX) 40 MG tablet, Take 1 tablet (40 mg total) by mouth daily., Disp: 90 tablet, Rfl: 1   Probiotic Product (CULTURELLE PROBIOTICS PO), Take by mouth., Disp: , Rfl:  silver sulfADIAZINE (SILVADENE) 1 % cream, Apply to affected area daily, Disp: 50 g, Rfl: 1  Allergies  Allergen Reactions   Penicillins Hives   Other Swelling and Rash    Eggplant allergy    I personally reviewed active problem list, medication list, allergies, family history, social history, health maintenance with the patient/caregiver today.   ROS  ***  Objective  There were no vitals filed for this visit.  There is no height or weight on file to calculate BMI.  Physical Exam ***  No results found for this or any previous visit (from the past 2160 hour(s)).   PHQ2/9: Depression screen [facility] 2/9 12/07/2020 11/16/2019 07/06/2019 01/13/2019  Decreased Interest 0 0 0 0  Down, Depressed, Hopeless 0  0 0 0  PHQ - 2 Score 0 0 0 0  Altered sleeping 2 1 0 0  Tired, decreased energy 2 2 0 0  Change in appetite 0 0 0 0  Feeling bad or failure about yourself  0 0 0 0  Trouble concentrating 0 0 0 0  Moving slowly or fidgety/restless 0 0 0 0  Suicidal thoughts 0 0 0 0  PHQ-9 Score 4 3 0 0  Difficult doing work/chores Not difficult at all Not difficult at all Not difficult at all -    phq 9 is {gen pos [id number]}   Fall Risk: Fall Risk  12/07/2020 11/16/2019 07/06/2019 01/13/2019  Falls in the past year? 0 0 0 0  Number falls in past yr: 0 0 0 0  Injury with Fall? 0 0 0 0  Follow up - - Falls evaluation completed -      Functional Status Survey:      Assessment & Plan  *** There are no diagnoses linked to this encounter.

## 2021-09-22 ENCOUNTER — Ambulatory Visit: Payer: Self-pay | Admitting: Family Medicine

## 2021-09-29 ENCOUNTER — Ambulatory Visit: Payer: Self-pay | Admitting: Nurse Practitioner

## 2021-10-06 ENCOUNTER — Ambulatory Visit (INDEPENDENT_AMBULATORY_CARE_PROVIDER_SITE_OTHER): Payer: BC Managed Care – PPO | Admitting: Nurse Practitioner

## 2021-10-06 ENCOUNTER — Encounter: Payer: Self-pay | Admitting: Nurse Practitioner

## 2021-10-06 ENCOUNTER — Other Ambulatory Visit: Payer: Self-pay

## 2021-10-06 VITALS — BP 124/86 | HR 87 | Temp 97.8°F | Resp 14 | Ht 62.0 in | Wt 152.9 lb

## 2021-10-06 DIAGNOSIS — Z131 Encounter for screening for diabetes mellitus: Secondary | ICD-10-CM

## 2021-10-06 DIAGNOSIS — R5383 Other fatigue: Secondary | ICD-10-CM | POA: Diagnosis not present

## 2021-10-06 DIAGNOSIS — J310 Chronic rhinitis: Secondary | ICD-10-CM

## 2021-10-06 DIAGNOSIS — R635 Abnormal weight gain: Secondary | ICD-10-CM | POA: Diagnosis not present

## 2021-10-06 DIAGNOSIS — F419 Anxiety disorder, unspecified: Secondary | ICD-10-CM | POA: Diagnosis not present

## 2021-10-06 DIAGNOSIS — K219 Gastro-esophageal reflux disease without esophagitis: Secondary | ICD-10-CM

## 2021-10-06 DIAGNOSIS — Z13220 Encounter for screening for lipoid disorders: Secondary | ICD-10-CM | POA: Diagnosis not present

## 2021-10-06 DIAGNOSIS — Z8711 Personal history of peptic ulcer disease: Secondary | ICD-10-CM

## 2021-10-06 MED ORDER — PANTOPRAZOLE SODIUM 40 MG PO TBEC: 40.0000 mg | DELAYED_RELEASE_TABLET | Freq: Every day | ORAL | 1 refills | Status: AC

## 2021-10-06 MED ORDER — AZELASTINE-FLUTICASONE 137-50 MCG/ACT NA SUSP: 1.0000 | Freq: Two times a day (BID) | NASAL | 3 refills | Status: DC

## 2021-10-06 MED ORDER — LEVOCETIRIZINE DIHYDROCHLORIDE 5 MG PO TABS: 5.0000 mg | ORAL_TABLET | Freq: Every evening | ORAL | 3 refills | Status: AC

## 2021-10-06 MED ORDER — FAMOTIDINE 20 MG PO TABS: 20.0000 mg | ORAL_TABLET | Freq: Two times a day (BID) | ORAL | 1 refills | Status: DC

## 2021-10-06 MED ORDER — ESCITALOPRAM OXALATE 10 MG PO TABS: 10.0000 mg | ORAL_TABLET | Freq: Every day | ORAL | 1 refills | Status: DC

## 2021-10-06 MED ORDER — BUSPIRONE HCL 15 MG PO TABS: 15.0000 mg | ORAL_TABLET | Freq: Two times a day (BID) | ORAL | 1 refills | Status: DC | PRN

## 2021-10-06 MED ORDER — FAMOTIDINE 20 MG PO TABS: 20.0000 mg | ORAL_TABLET | Freq: Every day | ORAL | 1 refills | Status: DC

## 2021-10-06 NOTE — Progress Notes (Signed)
BP 124/86   Pulse 87   Temp 97.8 F (36.6 C) (Oral)   Resp 14   Ht 5\' 2"  (1.575 m)   Wt 152 lb 14.4 oz (69.4 kg)   SpO2 98%   BMI 27.97 kg/m    Subjective:    Patient ID: Brianna Phillips, female    DOB: [birth year], 41 y.o.   MRN: [id number]  HPI: Brianna Phillips is a 41 y.o. female, here alone  Chief Complaint  Patient presents with   Depression   Anxiety    Medication refills    Weight: Highest weight is currently 152 lbs, she says she has been gaining weight.  She says she eats healthy and has not struggled with her weight in the past. Will get labs.  Fatigue: She says she has had fatigue for about two months.  She says she is having trouble going to bed.  She says that she will be tired all day long but then when she goes to lay down she gets a second wind. She says she falls asleep around 12 am and has to get up at 6 am. She wakes up tired.  She thinks she might snore.  She has not had a sleep study down.  She says she doesn't feel like she is falling into a deep sleep. Epworth sleepiness Scale score is a 6. She says she has one cup of coffee in the morning, maybe one in the afternoon.  She says she has stopped getting on her phone before going to bed.  Will get labs.   Chronic rhinitis: She currently takes dymista and xyzal.  She says her symptoms are pretty well controlled with that. She says that her symptoms are from eustachian tube dysfunction, she has a mild post-nasal drainage and cough when it happens.   Anxiety: Her PHQ9 is positive but likely due to having difficulty sleeping and fatigue.  She denies any suicidal thoughts. She says her medication is helping. She is currently on lexapro 10 mg daily, and buspar 15 mg BID. She says she is doing good on the current dosage.   GAD 7 : Generalized Anxiety Score 10/06/2021 12/07/2020 11/16/2019 07/06/2019  Nervous, Anxious, on Edge 0 0 1 3  Control/stop worrying 0 0 1 1  Worry too much - different things 0 0 3 3  Trouble  relaxing 0 0 0 3  Restless 0 0 0 0  Easily annoyed or irritable 0 0 1 3  Afraid - awful might happen 0 0 0 0  Total GAD 7 Score 0 0 6 13  Anxiety Difficulty Not difficult at all - Somewhat difficult Very difficult     Depression screen [facility] 2/9 10/06/2021 12/07/2020 11/16/2019 07/06/2019 01/13/2019  Decreased Interest 0 0 0 0 0  Down, Depressed, Hopeless 0 0 0 0 0  PHQ - 2 Score 0 0 0 0 0  Altered sleeping 2 2 1  0 0  Tired, decreased energy 3 2 2  0 0  Change in appetite 2 0 0 0 0  Feeling bad or failure about yourself  0 0 0 0 0  Trouble concentrating 0 0 0 0 0  Moving slowly or fidgety/restless 0 0 0 0 0  Suicidal thoughts 0 0 0 0 0  PHQ-9 Score 7 4 3  0 0  Difficult doing work/chores Somewhat difficult Not difficult at all Not difficult at all Not difficult at all -    GERD/ stomach ulcers: She says her acid reflux has  been acting up lately. She says she has been grabbing for the tums more lately. She is currently on pantoprazole 40 mg daily.  Will add pepcid at night and see if that helps.   Relevant past medical, surgical, family and social history reviewed and updated as indicated. Interim medical history since our last visit reviewed. Allergies and medications reviewed and updated.  Review of Systems  Constitutional: Negative for fever or weight change. Positive for fatigue Respiratory: Negative for cough and shortness of breath.   Cardiovascular: Negative for chest pain or palpitations.  Gastrointestinal: Negative for abdominal pain, no bowel changes.  Musculoskeletal: Negative for gait problem or joint swelling.  Skin: Negative for rash.  Neurological: Negative for dizziness or headache.  No other specific complaints in a complete review of systems (except as listed in HPI above).      Objective:    BP 124/86   Pulse 87   Temp 97.8 F (36.6 C) (Oral)   Resp 14   Ht 5\' 2"  (1.575 m)   Wt 152 lb 14.4 oz (69.4 kg)   SpO2 98%   BMI 27.97 kg/m   Wt Readings from Last 3  Encounters:  10/06/21 152 lb 14.4 oz (69.4 kg)  01/25/21 127 lb (57.6 kg)  12/07/20 146 lb 9.6 oz (66.5 kg)    Physical Exam  Constitutional: Patient appears well-developed and well-nourished. Overweight No distress.  HEENT: head atraumatic, normocephalic, pupils equal and reactive to light, neck supple Cardiovascular: Normal rate, regular rhythm and normal heart sounds.  No murmur heard. No BLE edema. Pulmonary/Chest: Effort normal and breath sounds normal. No respiratory distress. Abdominal: Soft.  There is no tenderness. Psychiatric: Patient has a normal mood and affect. behavior is normal. Judgment and thought content normal.   Results for orders placed or performed in visit on 01/13/19  COMPLETE METABOLIC PANEL WITH GFR  Result Value Ref Range   Glucose, Bld 86 65 - 99 mg/dL   BUN 15 7 - 25 mg/dL   Creat 9.60 4.54 - 0.98 mg/dL   GFR, Est Non African American 84 > OR = 60 mL/min/1.24m2   GFR, Est African American 97 > OR = 60 mL/min/1.69m2   BUN/Creatinine Ratio NOT APPLICABLE 6 - 22 (calc)   Sodium 139 135 - 146 mmol/L   Potassium 4.0 3.5 - 5.3 mmol/L   Chloride 104 98 - 110 mmol/L   CO2 24 20 - 32 mmol/L   Calcium 9.0 8.6 - 10.2 mg/dL   Total Protein 6.9 6.1 - 8.1 g/dL   Albumin 4.4 3.6 - 5.1 g/dL   Globulin 2.5 1.9 - 3.7 g/dL (calc)   AG Ratio 1.8 1.0 - 2.5 (calc)   Total Bilirubin 0.6 0.2 - 1.2 mg/dL   Alkaline phosphatase (APISO) 55 31 - 125 U/L   AST 18 10 - 30 U/L   ALT 15 6 - 29 U/L  Lipid panel  Result Value Ref Range   Cholesterol 189 <200 mg/dL   HDL 83 > OR = 50 mg/dL   Triglycerides 91 <119 mg/dL   LDL Cholesterol (Calc) 87 mg/dL (calc)   Total CHOL/HDL Ratio 2.3 <5.0 (calc)   Non-HDL Cholesterol (Calc) 106 <130 mg/dL (calc)  TSH  Result Value Ref Range   TSH 1.53 mIU/L  CBC with Differential/Platelet  Result Value Ref Range   WBC 5.7 3.8 - 10.8 Thousand/uL   RBC 4.19 3.80 - 5.10 Million/uL   Hemoglobin 13.7 11.7 - 15.5 g/dL   HCT 14.7 82.9 -  45.0  %   MCV 97.1 80.0 - 100.0 fL   MCH 32.7 27.0 - 33.0 pg   MCHC 33.7 32.0 - 36.0 g/dL   RDW 16.1 09.6 - 04.5 %   Platelets 269 140 - 400 Thousand/uL   MPV 11.2 7.5 - 12.5 fL   Neutro Abs 3,677 1,500 - 7,800 cells/uL   Lymphs Abs 1,231 850 - 3,900 cells/uL   Absolute Monocytes 633 200 - 950 cells/uL   Eosinophils Absolute 120 15 - 500 cells/uL   Basophils Absolute 40 0 - 200 cells/uL   Neutrophils Relative % 64.5 %   Total Lymphocyte 21.6 %   Monocytes Relative 11.1 %   Eosinophils Relative 2.1 %   Basophils Relative 0.7 %  VITAMIN D 25 Hydroxy (Vit-D Deficiency, Fractures)  Result Value Ref Range   Vit D, 25-Hydroxy 30 30 - 100 ng/mL  Cytology - PAP  Result Value Ref Range   Adequacy      Satisfactory for evaluation  endocervical/transformation zone component PRESENT.   Diagnosis      NEGATIVE FOR INTRAEPITHELIAL LESIONS OR MALIGNANCY. BENIGN REACTIVE/REPARATIVE CHANGES.   Material Submitted CervicoVaginal Pap [ThinPrep Imaged]    CYTOLOGY - PAP PAP RESULT       Assessment & Plan:   1. Weight gain  - CBC with Differential/Platelet - COMPLETE METABOLIC PANEL WITH GFR - Hemoglobin A1c - TSH  2. Other fatigue  - CBC with Differential/Platelet - COMPLETE METABOLIC PANEL WITH GFR - Hemoglobin A1c - TSH - Vitamin B12 - VITAMIN D 25 Hydroxy (Vit-D Deficiency, Fractures)  3. Chronic rhinitis  - Azelastine-Fluticasone (DYMISTA) 137-50 MCG/ACT SUSP; Place 1 spray into the nose 2 (two) times daily.  Dispense: 23 g; Refill: 3 - levocetirizine (XYZAL) 5 MG tablet; Take 1 tablet (5 mg total) by mouth every evening.  Dispense: 30 tablet; Refill: 3  4. Anxiety  - busPIRone (BUSPAR) 15 MG tablet; Take 1 tablet (15 mg total) by mouth 2 (two) times daily as needed.  Dispense: 180 tablet; Refill: 1 - escitalopram (LEXAPRO) 10 MG tablet; Take 1 tablet (10 mg total) by mouth daily.  Dispense: 90 tablet; Refill: 1  5. Gastroesophageal reflux disease without esophagitis  -  pantoprazole (PROTONIX) 40 MG tablet; Take 1 tablet (40 mg total) by mouth daily.  Dispense: 90 tablet; Refill: 1 - famotidine (PEPCID) 20 MG tablet; Take 1 tablet (20 mg total) by mouth at bedtime.  Dispense: 60 tablet; Refill: 1  6. History of stomach ulcers  - pantoprazole (PROTONIX) 40 MG tablet; Take 1 tablet (40 mg total) by mouth daily.  Dispense: 90 tablet; Refill: 1  7. Screening for diabetes mellitus - Hemoglobin A1c  8. Screening for cholesterol level  - Lipid panel   Follow up plan: Return in about 6 months (around 04/08/2022) for cpe.

## 2021-10-07 LAB — CBC WITH DIFFERENTIAL/PLATELET
Absolute Monocytes: 650 cells/uL (ref 200–950)
Basophils Absolute: 29 cells/uL (ref 0–200)
Basophils Relative: 0.5 %
Eosinophils Absolute: 91 cells/uL (ref 15–500)
Eosinophils Relative: 1.6 %
HCT: 34.6 % — ABNORMAL LOW (ref 35.0–45.0)
Hemoglobin: 11.6 g/dL — ABNORMAL LOW (ref 11.7–15.5)
Lymphs Abs: 1020 cells/uL (ref 850–3900)
MCH: 31.8 pg (ref 27.0–33.0)
MCHC: 33.5 g/dL (ref 32.0–36.0)
MCV: 94.8 fL (ref 80.0–100.0)
MPV: 10.9 fL (ref 7.5–12.5)
Monocytes Relative: 11.4 %
Neutro Abs: 3910 cells/uL (ref 1500–7800)
Neutrophils Relative %: 68.6 %
Platelets: 257 10*3/uL (ref 140–400)
RBC: 3.65 10*6/uL — ABNORMAL LOW (ref 3.80–5.10)
RDW: 11.9 % (ref 11.0–15.0)
Total Lymphocyte: 17.9 %
WBC: 5.7 10*3/uL (ref 3.8–10.8)

## 2021-10-07 LAB — COMPLETE METABOLIC PANEL WITH GFR
AG Ratio: 1.7 (calc) (ref 1.0–2.5)
ALT: 15 U/L (ref 6–29)
AST: 17 U/L (ref 10–30)
Albumin: 4.3 g/dL (ref 3.6–5.1)
Alkaline phosphatase (APISO): 54 U/L (ref 31–125)
BUN: 13 mg/dL (ref 7–25)
CO2: 27 mmol/L (ref 20–32)
Calcium: 9 mg/dL (ref 8.6–10.2)
Chloride: 104 mmol/L (ref 98–110)
Creat: 0.71 mg/dL (ref 0.50–0.99)
Globulin: 2.6 g/dL (calc) (ref 1.9–3.7)
Glucose, Bld: 93 mg/dL (ref 65–99)
Potassium: 4.1 mmol/L (ref 3.5–5.3)
Sodium: 139 mmol/L (ref 135–146)
Total Bilirubin: 0.5 mg/dL (ref 0.2–1.2)
Total Protein: 6.9 g/dL (ref 6.1–8.1)
eGFR: 109 mL/min/{1.73_m2} (ref >=60)

## 2021-10-07 LAB — LIPID PANEL
Cholesterol: 179 mg/dL (ref <200)
HDL: 65 mg/dL (ref >=50)
LDL Cholesterol (Calc): 89 mg/dL (calc)
Non-HDL Cholesterol (Calc): 114 mg/dL (calc) (ref <130)
Total CHOL/HDL Ratio: 2.8 (calc) (ref <5.0)
Triglycerides: 149 mg/dL (ref <150)

## 2021-10-07 LAB — VITAMIN D 25 HYDROXY (VIT D DEFICIENCY, FRACTURES): Vit D, 25-Hydroxy: 13 ng/mL — ABNORMAL LOW (ref 30–100)

## 2021-10-07 LAB — HEMOGLOBIN A1C
Hgb A1c MFr Bld: 5.1 % of total Hgb (ref <5.7)
Mean Plasma Glucose: 100 mg/dL
eAG (mmol/L): 5.5 mmol/L

## 2021-10-07 LAB — TSH: TSH: 1.62 mIU/L

## 2021-10-07 LAB — VITAMIN B12: Vitamin B-12: 331 pg/mL (ref 200–1100)

## 2021-10-09 ENCOUNTER — Other Ambulatory Visit: Payer: Self-pay | Admitting: Nurse Practitioner

## 2021-10-09 ENCOUNTER — Encounter: Payer: Self-pay | Admitting: Nurse Practitioner

## 2021-10-09 ENCOUNTER — Telehealth: Payer: Self-pay

## 2021-10-09 DIAGNOSIS — Z8711 Personal history of peptic ulcer disease: Secondary | ICD-10-CM

## 2021-10-09 DIAGNOSIS — E559 Vitamin D deficiency, unspecified: Secondary | ICD-10-CM

## 2021-10-09 DIAGNOSIS — K219 Gastro-esophageal reflux disease without esophagitis: Secondary | ICD-10-CM

## 2021-10-09 MED ORDER — VITAMIN D (ERGOCALCIFEROL) 1.25 MG (50000 UNIT) PO CAPS: 50000.0000 [IU] | ORAL_CAPSULE | ORAL | 0 refills | Status: AC

## 2021-10-09 NOTE — Telephone Encounter (Signed)
CALLED PATIENT NO ANSWER LEFT VOICEMAIL FOR A CALL BACK ? ?

## 2021-10-10 ENCOUNTER — Telehealth: Payer: Self-pay

## 2021-10-10 NOTE — Telephone Encounter (Signed)
Patient refused appointment our next available is June 1 she wants to be seen now I explained we have no openings she stated she will try another gi  ?

## 2021-10-12 ENCOUNTER — Ambulatory Visit: Payer: BC Managed Care – PPO | Admitting: Internal Medicine

## 2021-10-12 NOTE — Progress Notes (Deleted)
? ?Established Patient Office Visit ? ?Subjective:  ?Patient ID: Brianna Phillips, female    DOB: 04-25-1981  Age: 41 y.o. MRN: [id number] ? ?CC: No chief complaint on file. ? ? ?HPI ?Brianna Phillips presents for follow up on medical conditions.  ? ?GERD with Stomach Ulcers: ?-EGD ? ?-Currently on Protonix 40, Pepcid 20 ? ?-CBC on 10/06/21 showing hgb 11.6  ? ?Allergic Rhinitis: ?-Currently using Dymista and Xyzal  ? ?MDD/Anxiety: ?-Mood status: {Blank single:19197::"controlled","uncontrolled","better","worse","exacerbated","stable"} ?-Current treatment: Lexapro 10, Buspar 15 BID PRN ?-Satisfied with current treatment?: {Blank single:19197::"yes","no"} ?-Symptom severity: {Blank single:19197::"mild","moderate","severe"}  ?-Duration of current treatment : {Blank single:19197::"chronic","months","years"} ?-Side effects: {Blank single:19197::"yes","no"} ?Medication compliance: {Blank single:19197::"excellent compliance","good compliance","fair compliance","poor compliance"} ?Psychotherapy/counseling: {Blank single:19197::"yes","no"} {Blank single:19197::"current","in the past"} ?Previous psychiatric medications: {Blank multiple:19196::"abilify","amitryptiline","buspar","celexa","cymbalta","depakote","effexor","lamictal","lexapro","lithium","nortryptiline","paxil","prozac","pristiq (desvenlafaxine","seroquel","wellbutrin","zoloft","zyprexa"} ?Depressed mood: {Blank single:19197::"yes","no"} ?Anxious mood: {Blank single:19197::"yes","no"} ?Anhedonia: {Blank single:19197::"yes","no"} ?Significant weight loss or gain: {Blank single:19197::"yes","no"} ?Insomnia: {Blank single:19197::"yes","no"} {Blank single:19197::"hard to fall asleep","hard to stay asleep"} ?Fatigue: {Blank single:19197::"yes","no"} ?Feelings of worthlessness or guilt: {Blank single:19197::"yes","no"} ?Impaired concentration/indecisiveness: {Blank single:19197::"yes","no"} ?Suicidal ideations: {Blank single:19197::"yes","no"} ?Hopelessness: {Blank  single:19197::"yes","no"} ?Crying spells: {Blank single:19197::"yes","no"} ?Depression screen [facility] 2/9 10/06/2021 12/07/2020 11/16/2019 07/06/2019 01/13/2019  ?Decreased Interest 0 0 0 0 0  ?Down, Depressed, Hopeless 0 0 0 0 0  ?PHQ - 2 Score 0 0 0 0 0  ?Altered sleeping $RemoveBeforeDEI'2 2 1 'GDHPlnosJesVpcTh$ 0 0  ?Tired, decreased energy $RemoveBeforeDE'3 2 2 'JeOBExDnivtLljQ$ 0 0  ?Change in appetite 2 0 0 0 0  ?Feeling bad or failure about yourself  0 0 0 0 0  ?Trouble concentrating 0 0 0 0 0  ?Moving slowly or fidgety/restless 0 0 0 0 0  ?Suicidal thoughts 0 0 0 0 0  ?PHQ-9 Score $RemoveBefo'7 4 3 'YsuPmNXvShx$ 0 0  ?Difficult doing work/chores Somewhat difficult Not difficult at all Not difficult at all Not difficult at all -  ? ?Fatigue/Weight Gain: ?-Going on about 2 months  ?-Hours of sleep: ?-Sleep aids: ?-Previous sleep study: none ?-Snore: ?-Apneic Events: ?-Morning headaches: ?-Feeling refreshed in am: ?-Weight 152 highest weight. Labs 10/06/21 with TSH 1.62, Vitamin B12 331, Vitamin D 13 ?-Diet: ?-Exercise: ? ?Vitamin D Deficiency: ?-Vitamin D level 10/06/21 13, started on 50,000 IU weekly for 12 weeks ? ?Past Medical History:  ?Diagnosis Date  ? Cancer [facility])   ? ovarian   ? Family history of breast cancer   ? Family history of ovarian cancer   ? Gastric ulcer   ? TMJ (dislocation of temporomandibular joint)   ? ? ?Past Surgical History:  ?Procedure Laterality Date  ? DILATION AND CURETTAGE OF UTERUS    ? FINGER SURGERY    ? FRACTURE SURGERY    ? Left Wrist  ? I & D EXTREMITY  01/23/2012  ? Procedure: IRRIGATION AND DEBRIDEMENT EXTREMITY;  Surgeon: Linna Hoff, MD;  Location: [place];  Service: Orthopedics;  Laterality: Left;  Left Index Finger  ? joint arthorplasty Left   ? left wrist temporaomandibular  ? ? ?Family History  ?Problem Relation Age of Onset  ? Breast cancer Maternal Grandmother   ?     young  ? Breast cancer Maternal Aunt   ?     young  ? Ovarian cancer Maternal Aunt   ? ? ?Social History  ? ?Socioeconomic History  ? Marital status: Divorced  ?  Spouse name: Not on file  ? Number of  children: Not on file  ? Years of education: Not on file  ? Highest education level: Not on file  ?Occupational History  ?  Not on file  ?Tobacco Use  ? Smoking status: Former  ? Smokeless tobacco: Never  ?Vaping Use  ? Vaping Use: Never used  ?Substance and Sexual Activity  ? Alcohol use: Yes  ?  Comment: Ocassional  ? Drug use: No  ? Sexual activity: Yes  ?Other Topics Concern  ? Not on file  ?Social History Narrative  ? She is only working part time to support her kids emotionally, she lost her 43 yo daughter in 2021 and giving more support to her two living sons  ? ?Social Determinants of Health  ? ?Financial Resource Strain: Not on file  ?Food Insecurity: Not on file  ?Transportation Needs: Not on file  ?Physical Activity: Not on file  ?Stress: Not on file  ?Social Connections: Not on file  ?Intimate Partner Violence: Not on file  ? ? ?Outpatient Medications Prior to Visit  ?Medication Sig Dispense Refill  ? Azelastine-Fluticasone (DYMISTA) 137-50 MCG/ACT SUSP Place 1 spray into the nose 2 (two) times daily. 23 g 3  ? busPIRone (BUSPAR) 15 MG tablet Take 1 tablet (15 mg total) by mouth 2 (two) times daily as needed. 180 tablet 1  ? escitalopram (LEXAPRO) 10 MG tablet Take 1 tablet (10 mg total) by mouth daily. 90 tablet 1  ? famotidine (PEPCID) 20 MG tablet Take 1 tablet (20 mg total) by mouth at bedtime. 60 tablet 1  ? levocetirizine (XYZAL) 5 MG tablet Take 1 tablet (5 mg total) by mouth every evening. 30 tablet 3  ? pantoprazole (PROTONIX) 40 MG tablet Take 1 tablet (40 mg total) by mouth daily. 90 tablet 1  ? Vitamin D, Ergocalciferol, (DRISDOL) 1.25 MG (50000 UNIT) CAPS capsule Take 1 capsule (50,000 Units total) by mouth every 7 (seven) days. 8 capsule 0  ? ?No facility-administered medications prior to visit.  ? ? ?Allergies  ?Allergen Reactions  ? Penicillins Hives  ? Other Swelling and Rash  ?  Eggplant allergy  ? ? ?ROS ?Review of Systems ? ?  ?Objective:  ?  ?Physical Exam ? ?There were no vitals  taken for this visit. ?Wt Readings from Last 3 Encounters:  ?10/06/21 152 lb 14.4 oz (69.4 kg)  ?01/25/21 127 lb (57.6 kg)  ?12/07/20 146 lb 9.6 oz (66.5 kg)  ? ? ? ?Health Maintenance Due  ?Topic Date Due  ? COVID-19 Vaccine (1) Never done  ? HIV Screening  Never done  ? Hepatitis C Screening  Never done  ? ? ?There are no preventive care reminders to display for this patient. ? ?Lab Results  ?Component Value Date  ? TSH 1.62 10/06/2021  ? ?Lab Results  ?Component Value Date  ? WBC 5.7 10/06/2021  ? HGB 11.6 (L) 10/06/2021  ? HCT 34.6 (L) 10/06/2021  ? MCV 94.8 10/06/2021  ? PLT 257 10/06/2021  ? ?Lab Results  ?Component Value Date  ? NA 139 10/06/2021  ? K 4.1 10/06/2021  ? CO2 27 10/06/2021  ? GLUCOSE 93 10/06/2021  ? BUN 13 10/06/2021  ? CREATININE 0.71 10/06/2021  ? BILITOT 0.5 10/06/2021  ? ALKPHOS 68 03/14/2013  ? AST 17 10/06/2021  ? ALT 15 10/06/2021  ? PROT 6.9 10/06/2021  ? ALBUMIN 3.5 03/14/2013  ? CALCIUM 9.0 10/06/2021  ? EGFR 109 10/06/2021  ? ?Lab Results  ?Component Value Date  ? CHOL 179 10/06/2021  ? ?Lab Results  ?Component Value Date  ? HDL 65 10/06/2021  ? ?Lab Results  ?Component Value Date  ? [place] 89 10/06/2021  ? ?  Lab Results  ?Component Value Date  ? TRIG 149 10/06/2021  ? ?Lab Results  ?Component Value Date  ? CHOLHDL 2.8 10/06/2021  ? ?Lab Results  ?Component Value Date  ? HGBA1C 5.1 10/06/2021  ? ? ?  ?Assessment & Plan:  ? ?Problem List Items Addressed This Visit   ?None ? ? ?No orders of the defined types were placed in this encounter. ? ? ?Follow-up: No follow-ups on file.  ? ? ?Teodora Medici, DO ?

## 2021-10-13 ENCOUNTER — Telehealth (INDEPENDENT_AMBULATORY_CARE_PROVIDER_SITE_OTHER): Payer: BC Managed Care – PPO | Admitting: Internal Medicine

## 2021-10-13 ENCOUNTER — Encounter: Payer: Self-pay | Admitting: Internal Medicine

## 2021-10-13 DIAGNOSIS — E559 Vitamin D deficiency, unspecified: Secondary | ICD-10-CM | POA: Diagnosis not present

## 2021-10-13 DIAGNOSIS — F419 Anxiety disorder, unspecified: Secondary | ICD-10-CM

## 2021-10-13 DIAGNOSIS — Z8711 Personal history of peptic ulcer disease: Secondary | ICD-10-CM | POA: Diagnosis not present

## 2021-10-13 NOTE — Patient Instructions (Addendum)
It was great seeing you today! ? ?Plan discussed at today's visit: ?-Recommend Vitamin D supplements 50,000 IU once a week for 12 weeks, then switch to over the counter supplements, at least 1000 IU daily. ?-Recommend following with GI for repeat EGD ?-If you notice dark or sticky stools, please present to the emergency department  ? ?Follow up in: 3-4 months  ? ?Take care and let us know if you have any questions or concerns prior to your next visit. ? ?Dr. Rosana Berger ? ?

## 2021-10-13 NOTE — Progress Notes (Signed)
Virtual Visit via Video Note ? ?I connected with Brianna Phillips on 10/13/21 at 11:40 AM EST by a video enabled telemedicine application and verified that I am speaking with the correct person using two identifiers. ? ?Location: ?Patient: Home ?Provider: [facility] ?  ?I discussed the limitations of evaluation and management by telemedicine and the availability of in person appointments. The patient expressed understanding and agreed to proceed. ? ?History of Present Illness: ? ?Brianna Phillips is a 41 year old female presenting via telemedicine for follow up.  ? ?GERD with Stomach Ulcers: ?-EGD in the past, this was when ulcers were first diagnosed  ?-Currently on Protonix 40 but sometimes has to take twice a day to control epigastric pain symptoms  ?-Having an increase in frequency and severity of flares since February  ?-CBC on 10/06/21 showing hgb 11.6  ?-Denies blood in stools, diarrhea, more constipated for the last week. Usually has a BM every day but only 3 times in the last week ? ?MDD/Anxiety: ?-Mood status: stable ?-Current treatment: Lexapro 10, Buspar 15 BID PRN ?-Satisfied with current treatment?: yes ? ?Depression screen [facility] 2/9 10/13/2021 10/06/2021 12/07/2020 11/16/2019 07/06/2019  ?Decreased Interest 0 0 0 0 0  ?Down, Depressed, Hopeless 0 0 0 0 0  ?PHQ - 2 Score 0 0 0 0 0  ?Altered sleeping 0 '2 2 1 '$ 0  ?Tired, decreased energy 0 '3 2 2 '$ 0  ?Change in appetite 0 2 0 0 0  ?Feeling bad or failure about yourself  0 0 0 0 0  ?Trouble concentrating 0 0 0 0 0  ?Moving slowly or fidgety/restless 0 0 0 0 0  ?Suicidal thoughts 0 0 0 0 0  ?PHQ-9 Score 0 '7 4 3 '$ 0  ?Difficult doing work/chores Not difficult at all Somewhat difficult Not difficult at all Not difficult at all Not difficult at all  ? ?Fatigue/Weight Gain: ?-Going on about 2 months, also noticed hair and nails more brittle  ? ?Vitamin D Deficiency: ?-Vitamin D level 10/06/21 13, started on 50,000 IU weekly for 12 weeks - hasn't started  yet ? ?Observations/Objective: ? ?General: well appearing, no acute distress ?ENT: conjunctiva normal appearing bilaterally  ?Skin: no rashes, cyanosis or abnormal bruising noted ?Neuro: answers all questions appropriately  ? ?Assessment and Plan: ? ?1. History of stomach ulcers: Recommend follow up with GI for repeat EGD. Continue Protonix 40 mg daily.  ? ?2. Vitamin D deficiency: Begin 50,000 IU supplementation x 12 weeks, the switch to 1000 IU daily OTC. Plan to recheck levels at follow up.  ? ?3. Anxiety: Stable, doing well on current Lexapro dose.  ? ? ?Follow Up Instructions: 3-4 months  ? ?  ?I discussed the assessment and treatment plan with the patient. The patient was provided an opportunity to ask questions and all were answered. The patient agreed with the plan and demonstrated an understanding of the instructions. ?  ?The patient was advised to call back or seek an in-person evaluation if the symptoms worsen or if the condition fails to improve as anticipated. ? ?I provided 20 minutes of non-face-to-face time during this encounter. ? ? ?Teodora Medici, DO ? ?

## 2021-10-16 ENCOUNTER — Encounter: Payer: Self-pay | Admitting: Internal Medicine

## 2021-11-01 ENCOUNTER — Encounter: Payer: Self-pay | Admitting: Internal Medicine

## 2021-11-01 DIAGNOSIS — J069 Acute upper respiratory infection, unspecified: Secondary | ICD-10-CM

## 2021-11-01 MED ORDER — METHYLPREDNISOLONE 4 MG PO TBPK: ORAL_TABLET | ORAL | 0 refills | Status: DC

## 2021-11-08 ENCOUNTER — Ambulatory Visit (INDEPENDENT_AMBULATORY_CARE_PROVIDER_SITE_OTHER): Payer: BC Managed Care – PPO | Admitting: Gastroenterology

## 2021-11-08 ENCOUNTER — Encounter: Payer: Self-pay | Admitting: Gastroenterology

## 2021-11-08 VITALS — BP 137/89 | HR 80 | Temp 98.1°F | Ht 62.0 in | Wt 152.0 lb

## 2021-11-08 DIAGNOSIS — Z8371 Family history of colonic polyps: Secondary | ICD-10-CM | POA: Diagnosis not present

## 2021-11-08 DIAGNOSIS — K219 Gastro-esophageal reflux disease without esophagitis: Secondary | ICD-10-CM

## 2021-11-08 MED ORDER — NA SULFATE-K SULFATE-MG SULF 17.5-3.13-1.6 GM/177ML PO SOLN: 1.0000 | Freq: Once | ORAL | 0 refills | Status: AC

## 2021-11-08 NOTE — H&P (View-Only) (Signed)
? ? ?Gastroenterology Consultation ? ?Referring Provider:     Teodora Medici, DO ?Primary Care Physician:  Teodora Medici, DO ?Primary Gastroenterologist:  Dr. Allen Norris     ?Reason for Consultation:     GERD ?      ? HPI:   ?Brianna Phillips is a 41 y.o. y/o female referred for consultation & management of GERD by Dr. Teodora Medici, DO.  This patient comes in today with a recent visit to her primary care provider and reported at that time that she has had an EGD in the past with a diagnosis of ulcers.  She had been taking pantoprazole 40 mg/day with sometimes increasing the dose to twice a day.  Her hemoglobin in March was 11.6 and she had seen her primary care provider because of increasing frequency of her epigastric pains.  At the time of that office visit the patient had denied any black stools or bloody stools. ?The EGD was in 2005 and reports some dar stools and epigastric pain. ?She reports that she has trouble getting liquids and solids sometimes when she has flares. No weight loss.  The patient reports that her mother had polyps and gets a colonoscopy every 5 years and this was before the age of 29. ?The patient denies any unexplained weight loss and states she is actually gaining weight.  There is no report of any abdominal pain anywhere else except the epigastric area and chest area.  She also reports that at times when it gets bad she can have some radiation to her back ? ? ?Past Medical History:  ?Diagnosis Date  ? 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 Family history of breast cancer   ? Family history of ovarian cancer   ? Gastric ulcer   ? TMJ (dislocation of temporomandibular joint)   ? ? ?Past Surgical History:  ?Procedure Laterality Date  ? DILATION AND CURETTAGE OF UTERUS    ? FINGER SURGERY    ? FRACTURE SURGERY    ? Left Wrist  ? I & D EXTREMITY  01/23/2012  ? Procedure: IRRIGATION AND DEBRIDEMENT EXTREMITY;  Surgeon: Linna Hoff, MD;  Location: [place];  Service: Orthopedics;  Laterality:  Left;  Left Index Finger  ? joint arthorplasty Left   ? left wrist temporaomandibular  ? ? ?Prior to Admission medications   ?Medication Sig Start Date End Date Taking? Authorizing Provider  ?Azelastine-Fluticasone (DYMISTA) 137-50 MCG/ACT SUSP Place 1 spray into the nose 2 (two) times daily. 10/06/21   Bo Merino, FNP  ?busPIRone (BUSPAR) 15 MG tablet Take 1 tablet (15 mg total) by mouth 2 (two) times daily as needed. 10/06/21   Bo Merino, FNP  ?escitalopram (LEXAPRO) 10 MG tablet Take 1 tablet (10 mg total) by mouth daily. 10/06/21   Bo Merino, FNP  ?famotidine (PEPCID) 20 MG tablet Take 1 tablet (20 mg total) by mouth at bedtime. 10/06/21   Bo Merino, FNP  ?levocetirizine (XYZAL) 5 MG tablet Take 1 tablet (5 mg total) by mouth every evening. 10/06/21   Bo Merino, FNP  ?methylPREDNISolone (MEDROL DOSEPAK) 4 MG TBPK tablet Day 1: Take 8 mg (2 tablets) before breakfast, 4 mg (1 tablet) after lunch, 4 mg (1 tablet) after supper, and 8 mg (2 tablets) at bedtime. Day 2:Take 4 mg (1 tablet) before breakfast, 4 mg (1 tablet) after lunch, 4 mg (1 tablet) after supper, and 8 mg (2 tablets) at bedtime. Day 3: Take 4 mg (1  tablet) before breakfast, 4 mg (1 tablet) after lunch, 4 mg (1 tablet) after supper, and 4 mg (1 tablet) at bedtime. Day 4: Take 4 mg (1 tablet) before breakfast, 4 mg (1 tablet) after lunch, and 4 mg (1 tablet) at bedtime. Day 5: Take 4 mg (1 tablet) before breakfast and 4 mg (1 tablet) at bedtime. Day 6: Take 4 mg (1 tablet) before breakfast. 11/01/21   Teodora Medici, DO  ?pantoprazole (PROTONIX) 40 MG tablet Take 1 tablet (40 mg total) by mouth daily. 10/06/21   Bo Merino, FNP  ?Vitamin D, Ergocalciferol, (DRISDOL) 1.25 MG (50000 UNIT) CAPS capsule Take 1 capsule (50,000 Units total) by mouth every 7 (seven) days. 10/09/21   Bo Merino, FNP  ? ? ?Family History  ?Problem Relation Age of Onset  ? Breast cancer Maternal Grandmother   ?     young  ? Breast cancer Maternal Aunt    ?     young  ? Ovarian cancer Maternal Aunt   ?  ? ?Social History  ? ?Tobacco Use  ? Smoking status: Former  ? Smokeless tobacco: Never  ?Vaping Use  ? Vaping Use: Never used  ?Substance Use Topics  ? Alcohol use: Yes  ?  Comment: Ocassional  ? Drug use: No  ? ? ?Allergies as of 11/08/2021 - Review Complete 10/13/2021  ?Allergen Reaction Noted  ? Penicillins Hives 01/16/2012  ? Other Swelling and Rash 03/14/2013  ? ? ?Review of Systems:    ?All systems reviewed and negative except where noted in HPI. ? ? Physical Exam:  ?There were no vitals taken for this visit. ?No LMP recorded. ?General:   Alert,  Well-developed, well-nourished, pleasant and cooperative in NAD ?Head:  Normocephalic and atraumatic. ?Eyes:  Sclera clear, no icterus.   Conjunctiva pink. ?Ears:  Normal auditory acuity. ?Neck:  Supple; no masses or thyromegaly. ?Lungs:  Respirations even and unlabored.  Clear throughout to auscultation.   No wheezes, crackles, or rhonchi. No acute distress. ?Heart:  Regular rate and rhythm; no murmurs, clicks, rubs, or gallops. ?Abdomen:  Normal bowel sounds.  No bruits.  Soft, non-tender and non-distended without masses, hepatosplenomegaly or hernias noted.  No guarding or rebound tenderness.  Negative Carnett sign.   ?Rectal:  Deferred.  ?Pulses:  Normal pulses noted. ?Extremities:  No clubbing or edema.  No cyanosis. ?Neurologic:  Alert and oriented x3;  grossly normal neurologically. ?Skin:  Intact without significant lesions or rashes.  No jaundice. ?Lymph Nodes:  No significant cervical adenopathy. ?Psych:  Alert and cooperative. Normal mood and affect. ? ?Imaging Studies: ?No results found. ? ?Assessment and Plan:  ? ?Brianna Phillips is a 41 y.o. y/o female who comes in today with a history of GERD and peptic ulcer disease.  The patient also reports that her mother had polyps before the age of 18 and gets a colonoscopy every 5 years.  The patient also states that she has acid breakthrough despite being on  the pantoprazole.  The patient will be set up for an EGD and colonoscopy.  The patient has been explained the plan agrees with it. ? ? ? ?Lucilla Lame, MD. Mar[facility] Regal ? ? ? Note: This dictation was prepared with Dragon dictation along with smaller phrase technology. Any transcriptional errors that result from this process are unintentional.   ?

## 2021-11-08 NOTE — Progress Notes (Signed)
? ? ?Gastroenterology Consultation ? ?Referring Provider:     Teodora Medici, DO ?Primary Care Physician:  Teodora Medici, DO ?Primary Gastroenterologist:  Dr. Allen Norris     ?Reason for Consultation:     GERD ?      ? HPI:   ?Brianna Phillips is a 41 y.o. y/o female referred for consultation & management of GERD by Dr. Teodora Medici, DO.  This patient comes in today with a recent visit to her primary care provider and reported at that time that she has had an EGD in the past with a diagnosis of ulcers.  She had been taking pantoprazole 40 mg/day with sometimes increasing the dose to twice a day.  Her hemoglobin in March was 11.6 and she had seen her primary care provider because of increasing frequency of her epigastric pains.  At the time of that office visit the patient had denied any black stools or bloody stools. ?The EGD was in 2005 and reports some dar stools and epigastric pain. ?She reports that she has trouble getting liquids and solids sometimes when she has flares. No weight loss.  The patient reports that her mother had polyps and gets a colonoscopy every 5 years and this was before the age of 77. ?The patient denies any unexplained weight loss and states she is actually gaining weight.  There is no report of any abdominal pain anywhere else except the epigastric area and chest area.  She also reports that at times when it gets bad she can have some radiation to her back ? ? ?Past Medical History:  ?Diagnosis Date  ? Cancer [facility])   ? ovarian   ? Family history of breast cancer   ? Family history of ovarian cancer   ? Gastric ulcer   ? TMJ (dislocation of temporomandibular joint)   ? ? ?Past Surgical History:  ?Procedure Laterality Date  ? DILATION AND CURETTAGE OF UTERUS    ? FINGER SURGERY    ? FRACTURE SURGERY    ? Left Wrist  ? I & D EXTREMITY  01/23/2012  ? Procedure: IRRIGATION AND DEBRIDEMENT EXTREMITY;  Surgeon: Linna Hoff, MD;  Location: [place];  Service: Orthopedics;  Laterality:  Left;  Left Index Finger  ? joint arthorplasty Left   ? left wrist temporaomandibular  ? ? ?Prior to Admission medications   ?Medication Sig Start Date End Date Taking? Authorizing Provider  ?Azelastine-Fluticasone (DYMISTA) 137-50 MCG/ACT SUSP Place 1 spray into the nose 2 (two) times daily. 10/06/21   Bo Merino, FNP  ?busPIRone (BUSPAR) 15 MG tablet Take 1 tablet (15 mg total) by mouth 2 (two) times daily as needed. 10/06/21   Bo Merino, FNP  ?escitalopram (LEXAPRO) 10 MG tablet Take 1 tablet (10 mg total) by mouth daily. 10/06/21   Bo Merino, FNP  ?famotidine (PEPCID) 20 MG tablet Take 1 tablet (20 mg total) by mouth at bedtime. 10/06/21   Bo Merino, FNP  ?levocetirizine (XYZAL) 5 MG tablet Take 1 tablet (5 mg total) by mouth every evening. 10/06/21   Bo Merino, FNP  ?methylPREDNISolone (MEDROL DOSEPAK) 4 MG TBPK tablet Day 1: Take 8 mg (2 tablets) before breakfast, 4 mg (1 tablet) after lunch, 4 mg (1 tablet) after supper, and 8 mg (2 tablets) at bedtime. Day 2:Take 4 mg (1 tablet) before breakfast, 4 mg (1 tablet) after lunch, 4 mg (1 tablet) after supper, and 8 mg (2 tablets) at bedtime. Day 3: Take 4 mg (1  tablet) before breakfast, 4 mg (1 tablet) after lunch, 4 mg (1 tablet) after supper, and 4 mg (1 tablet) at bedtime. Day 4: Take 4 mg (1 tablet) before breakfast, 4 mg (1 tablet) after lunch, and 4 mg (1 tablet) at bedtime. Day 5: Take 4 mg (1 tablet) before breakfast and 4 mg (1 tablet) at bedtime. Day 6: Take 4 mg (1 tablet) before breakfast. 11/01/21   Teodora Medici, DO  ?pantoprazole (PROTONIX) 40 MG tablet Take 1 tablet (40 mg total) by mouth daily. 10/06/21   Bo Merino, FNP  ?Vitamin D, Ergocalciferol, (DRISDOL) 1.25 MG (50000 UNIT) CAPS capsule Take 1 capsule (50,000 Units total) by mouth every 7 (seven) days. 10/09/21   Bo Merino, FNP  ? ? ?Family History  ?Problem Relation Age of Onset  ? Breast cancer Maternal Grandmother   ?     young  ? Breast cancer Maternal Aunt    ?     young  ? Ovarian cancer Maternal Aunt   ?  ? ?Social History  ? ?Tobacco Use  ? Smoking status: Former  ? Smokeless tobacco: Never  ?Vaping Use  ? Vaping Use: Never used  ?Substance Use Topics  ? Alcohol use: Yes  ?  Comment: Ocassional  ? Drug use: No  ? ? ?Allergies as of 11/08/2021 - Review Complete 10/13/2021  ?Allergen Reaction Noted  ? Penicillins Hives 01/16/2012  ? Other Swelling and Rash 03/14/2013  ? ? ?Review of Systems:    ?All systems reviewed and negative except where noted in HPI. ? ? Physical Exam:  ?There were no vitals taken for this visit. ?No LMP recorded. ?General:   Alert,  Well-developed, well-nourished, pleasant and cooperative in NAD ?Head:  Normocephalic and atraumatic. ?Eyes:  Sclera clear, no icterus.   Conjunctiva pink. ?Ears:  Normal auditory acuity. ?Neck:  Supple; no masses or thyromegaly. ?Lungs:  Respirations even and unlabored.  Clear throughout to auscultation.   No wheezes, crackles, or rhonchi. No acute distress. ?Heart:  Regular rate and rhythm; no murmurs, clicks, rubs, or gallops. ?Abdomen:  Normal bowel sounds.  No bruits.  Soft, non-tender and non-distended without masses, hepatosplenomegaly or hernias noted.  No guarding or rebound tenderness.  Negative Carnett sign.   ?Rectal:  Deferred.  ?Pulses:  Normal pulses noted. ?Extremities:  No clubbing or edema.  No cyanosis. ?Neurologic:  Alert and oriented x3;  grossly normal neurologically. ?Skin:  Intact without significant lesions or rashes.  No jaundice. ?Lymph Nodes:  No significant cervical adenopathy. ?Psych:  Alert and cooperative. Normal mood and affect. ? ?Imaging Studies: ?No results found. ? ?Assessment and Plan:  ? ?Brianna Phillips is a 41 y.o. y/o female who comes in today with a history of GERD and peptic ulcer disease.  The patient also reports that her mother had polyps before the age of 90 and gets a colonoscopy every 5 years.  The patient also states that she has acid breakthrough despite being on  the pantoprazole.  The patient will be set up for an EGD and colonoscopy.  The patient has been explained the plan agrees with it. ? ? ? ?Lucilla Lame, MD. Marval Regal ? ? ? Note: This dictation was prepared with Dragon dictation along with smaller phrase technology. Any transcriptional errors that result from this process are unintentional.   ?

## 2021-11-22 ENCOUNTER — Encounter: Payer: Self-pay | Admitting: Gastroenterology

## 2021-11-22 ENCOUNTER — Other Ambulatory Visit: Payer: Self-pay

## 2021-12-01 ENCOUNTER — Ambulatory Visit: Payer: BC Managed Care – PPO | Admitting: Anesthesiology

## 2021-12-01 ENCOUNTER — Ambulatory Visit
Admission: RE | Admit: 2021-12-01 | Discharge: 2021-12-01 | Disposition: A | Discharge status: Home / Self Care | Payer: BC Managed Care – PPO | Attending: Gastroenterology | Admitting: Gastroenterology

## 2021-12-01 ENCOUNTER — Encounter: Payer: Self-pay | Admitting: Gastroenterology

## 2021-12-01 ENCOUNTER — Encounter
Admission: RE | Disposition: A | Discharge status: Home / Self Care | Payer: Self-pay | Source: Home / Self Care | Attending: Gastroenterology

## 2021-12-01 ENCOUNTER — Other Ambulatory Visit: Payer: Self-pay

## 2021-12-01 DIAGNOSIS — Z8371 Family history of colonic polyps: Secondary | ICD-10-CM

## 2021-12-01 DIAGNOSIS — K2970 Gastritis, unspecified, without bleeding: Secondary | ICD-10-CM | POA: Diagnosis not present

## 2021-12-01 DIAGNOSIS — Z8543 Personal history of malignant neoplasm of ovary: Secondary | ICD-10-CM | POA: Diagnosis not present

## 2021-12-01 DIAGNOSIS — Z1211 Encounter for screening for malignant neoplasm of colon: Secondary | ICD-10-CM | POA: Diagnosis not present

## 2021-12-01 DIAGNOSIS — K222 Esophageal obstruction: Secondary | ICD-10-CM | POA: Insufficient documentation

## 2021-12-01 DIAGNOSIS — Z87891 Personal history of nicotine dependence: Secondary | ICD-10-CM | POA: Diagnosis not present

## 2021-12-01 DIAGNOSIS — K219 Gastro-esophageal reflux disease without esophagitis: Secondary | ICD-10-CM | POA: Insufficient documentation

## 2021-12-01 DIAGNOSIS — K319 Disease of stomach and duodenum, unspecified: Secondary | ICD-10-CM | POA: Diagnosis not present

## 2021-12-01 DIAGNOSIS — R12 Heartburn: Secondary | ICD-10-CM | POA: Insufficient documentation

## 2021-12-01 DIAGNOSIS — K648 Other hemorrhoids: Secondary | ICD-10-CM | POA: Diagnosis not present

## 2021-12-01 DIAGNOSIS — Z8711 Personal history of peptic ulcer disease: Secondary | ICD-10-CM | POA: Insufficient documentation

## 2021-12-01 DIAGNOSIS — K449 Diaphragmatic hernia without obstruction or gangrene: Secondary | ICD-10-CM | POA: Insufficient documentation

## 2021-12-01 HISTORY — DX: Other specified postprocedural states: Z98.890

## 2021-12-01 HISTORY — DX: Nausea with vomiting, unspecified: R11.2

## 2021-12-01 HISTORY — PX: COLONOSCOPY: SHX5424

## 2021-12-01 HISTORY — DX: Other complications of anesthesia, initial encounter: T88.59XA

## 2021-12-01 HISTORY — PX: ESOPHAGOGASTRODUODENOSCOPY: SHX5428

## 2021-12-01 SURGERY — COLONOSCOPY
Anesthesia: General | Site: Rectum

## 2021-12-01 MED ORDER — SIMETHICONE 40 MG/0.6ML PO SUSP
ORAL | Status: DC | PRN
  Administered 2021-12-01: 120 mL

## 2021-12-01 MED ORDER — ACETAMINOPHEN 325 MG PO TABS: 325.0000 mg | ORAL_TABLET | ORAL | Status: DC | PRN

## 2021-12-01 MED ORDER — PROPOFOL 10 MG/ML IV BOLUS
INTRAVENOUS | Status: DC | PRN
  Administered 2021-12-01: 100 mg via INTRAVENOUS
  Administered 2021-12-01 (×5): 50 mg via INTRAVENOUS
  Administered 2021-12-01: 100 mg via INTRAVENOUS
  Administered 2021-12-01: 50 mg via INTRAVENOUS
  Administered 2021-12-01: 100 mg via INTRAVENOUS

## 2021-12-01 MED ORDER — LIDOCAINE HCL (CARDIAC) PF 100 MG/5ML IV SOSY
PREFILLED_SYRINGE | INTRAVENOUS | Status: DC | PRN
  Administered 2021-12-01: 30 mg via INTRAVENOUS

## 2021-12-01 MED ORDER — ACETAMINOPHEN 160 MG/5ML PO SOLN: 325.0000 mg | ORAL | Status: DC | PRN

## 2021-12-01 MED ORDER — GLYCOPYRROLATE 0.2 MG/ML IJ SOLN
INTRAMUSCULAR | Status: DC | PRN
  Administered 2021-12-01: .2 mg via INTRAVENOUS

## 2021-12-01 MED ORDER — LACTATED RINGERS IV SOLN: INTRAVENOUS | Status: DC

## 2021-12-01 MED ORDER — ONDANSETRON HCL 4 MG/2ML IJ SOLN
4.0000 mg | Freq: Once | INTRAMUSCULAR | Status: AC | PRN
  Administered 2021-12-01: 4 mg via INTRAVENOUS

## 2021-12-01 MED ORDER — SODIUM CHLORIDE 0.9 % IV SOLN: INTRAVENOUS | Status: DC

## 2021-12-01 MED ORDER — STERILE WATER FOR IRRIGATION IR SOLN
Status: DC | PRN
Start: 2021-12-01 — End: 2021-12-01
  Administered 2021-12-01: 150 mL

## 2021-12-01 SURGICAL SUPPLY — 25 items
BALLN DILATOR 15-18 8 (BALLOONS) ×4
BALLOON DILATOR 15-18 8 (BALLOONS) IMPLANT
CLIP HMST 235XBRD CATH ROT (MISCELLANEOUS) IMPLANT
CLIP RESOLUTION 360 11X235 (MISCELLANEOUS)
ELECT REM PT RETURN 9FT ADLT (ELECTROSURGICAL)
ELECTRODE REM PT RTRN 9FT ADLT (ELECTROSURGICAL) IMPLANT
FORCEPS BIOP RAD 4 LRG CAP 4 (CUTTING FORCEPS) ×1 IMPLANT
GOWN CVR UNV OPN BCK APRN NK (MISCELLANEOUS) ×6 IMPLANT
GOWN ISOL THUMB LOOP REG UNIV (MISCELLANEOUS) ×8
INJECTOR VARIJECT VIN23 (MISCELLANEOUS) IMPLANT
KIT DEFENDO VALVE AND CONN (KITS) IMPLANT
KIT PRC NS LF DISP ENDO (KITS) ×3 IMPLANT
KIT PROCEDURE OLYMPUS (KITS) ×4
MANIFOLD NEPTUNE II (INSTRUMENTS) ×4 IMPLANT
MARKER SPOT ENDO TATTOO 5ML (MISCELLANEOUS) IMPLANT
PROBE APC STR FIRE (PROBE) IMPLANT
RETRIEVER NET ROTH 2.5X230 LF (MISCELLANEOUS) IMPLANT
SNARE COLD EXACTO (MISCELLANEOUS) IMPLANT
SNARE SHORT THROW 13M SML OVAL (MISCELLANEOUS) IMPLANT
SNARE SNG USE RND 15MM (INSTRUMENTS) IMPLANT
SPOT EX ENDOSCOPIC TATTOO (MISCELLANEOUS)
SYR INFLATION 60ML (SYRINGE) ×1 IMPLANT
TRAP ETRAP POLY (MISCELLANEOUS) IMPLANT
VARIJECT INJECTOR VIN23 (MISCELLANEOUS)
WATER STERILE IRR 250ML POUR (IV SOLUTION) ×4 IMPLANT

## 2021-12-01 NOTE — Anesthesia Procedure Notes (Signed)
Procedure Name: [place] ?Date/Time: 12/01/2021 8:45 AM ?Performed by: Georga Bora, CRNA ?Pre-anesthesia Checklist: Patient identified, Emergency Drugs available, Suction available, Patient being monitored and Timeout performed ?Patient Re-evaluated:Patient Re-evaluated prior to induction ?Oxygen Delivery Method: Nasal cannula ?Placement Confirmation: positive ETCO2 and breath sounds checked- equal and bilateral ? ? ? ? ?

## 2021-12-01 NOTE — Anesthesia Preprocedure Evaluation (Signed)
Anesthesia Evaluation  ?Patient identified by MRN, date of birth, ID band ?Patient awake ? ? ? ?Reviewed: ?Allergy & Precautions, NPO status  ? ?History of Anesthesia Complications ?(+) PONV and history of anesthetic complications ? ?Airway ?Mallampati: II ? ?TM Distance: >3 FB ? ? ? ? Dental ?  ?Pulmonary ?former smoker,  ?  ?Pulmonary exam normal ? ? ? ? ? ? ? Cardiovascular ? ?Rhythm:Regular Rate:Normal ? ? ?  ?Neuro/Psych ?  ? GI/Hepatic ?PUD,   ?Endo/Other  ? ? Renal/GU ?  ? ?  ?Musculoskeletal ? ? Abdominal ?  ?Peds ? Hematology ?  ?Anesthesia Other Findings ?Hx ovarian cancer ? Reproductive/Obstetrics ? ?  ? ? ? ? ? ? ? ? ? ? ? ? ? ?  ?  ? ? ? ? ? ? ? ? ?Anesthesia Physical ?Anesthesia Plan ? ?ASA: 2 ? ?Anesthesia Plan: General  ? ?Post-op Pain Management:   ? ?Induction: Intravenous ? ?PONV Risk Score and Plan: 4 or greater and Propofol infusion, TIVA and Treatment may vary due to age or medical condition ? ?Airway Management Planned: Natural Airway and Nasal Cannula ? ?Additional Equipment:  ? ?Intra-op Plan:  ? ?Post-operative Plan:  ? ?Informed Consent: I have reviewed the patients History and Physical, chart, labs and discussed the procedure including the risks, benefits and alternatives for the proposed anesthesia with the patient or authorized representative who has indicated his/her understanding and acceptance.  ? ? ? ? ? ?Plan Discussed with: CRNA ? ?Anesthesia Plan Comments:   ? ? ? ? ? ? ?Anesthesia Quick Evaluation ? ?

## 2021-12-01 NOTE — Anesthesia Postprocedure Evaluation (Signed)
Anesthesia Post Note ? ?Patient: Brianna Phillips ? ?Procedure(s) Performed: COLONOSCOPY (Rectum) ?ESOPHAGOGASTRODUODENOSCOPY (EGD) (Mouth) ?Balloon dilation wire-guided (Mouth) ? ? ?  ?Patient location during evaluation: PACU ?Anesthesia Type: General ?Level of consciousness: awake ?Pain management: pain level controlled ?Vital Signs Assessment: post-procedure vital signs reviewed and stable ?Respiratory status: respiratory function stable ?Cardiovascular status: stable ?Postop Assessment: no signs of nausea or vomiting ?Anesthetic complications: no ? ? ?No notable events documented. ? ?Veda Canning ? ? ? ? ? ?

## 2021-12-01 NOTE — Op Note (Addendum)
[facility] ?Gastroenterology ?Patient Name: Brianna Phillips ?Procedure Date: 12/01/2021 8:32 AM ?MRN: [id number] ?Account #: [id number] ?Date of Birth: April 20, 1981 ?Admit Type: Outpatient ?Age: 41 ?Room: [facility] OR ROOM 01 ?Gender: Female ?Note Status: Finalized ?Instrument Name: [id number] ?Procedure:             Colonoscopy ?Indications:           Screening for colorectal malignant neoplasm, Family  ?                       history of colonic polyps in a first-degree relative ?Providers:             Lucilla Lame MD, MD ?Medicines:             Propofol per Anesthesia ?Complications:         No immediate complications. ?Procedure:             Pre-Anesthesia Assessment: ?                       - Prior to the procedure, a History and Physical was  ?                       performed, and patient medications and allergies were  ?                       reviewed. The patient's tolerance of previous  ?                       anesthesia was also reviewed. The risks and benefits  ?                       of the procedure and the sedation options and risks  ?                       were discussed with the patient. All questions were  ?                       answered, and informed consent was obtained. Prior  ?                       Anticoagulants: The patient has taken no previous  ?                       anticoagulant or antiplatelet agents. ASA Grade  ?                       Assessment: II - A patient with mild systemic disease.  ?                       After reviewing the risks and benefits, the patient  ?                       was deemed in satisfactory condition to undergo the  ?                       procedure. ?                       After obtaining informed consent, the colonoscope was  ?  passed under direct vision. Throughout the procedure,  ?                       the patient's blood pressure, pulse, and oxygen  ?                       saturations were monitored continuously. The was  ?                        introduced through the anus and advanced to the the  ?                       cecum, identified by appendiceal orifice and ileocecal  ?                       valve. The colonoscopy was performed without  ?                       difficulty. The patient tolerated the procedure well.  ?                       The quality of the bowel preparation was excellent. ?Findings: ?     The perianal and digital rectal examinations were normal. ?     Non-bleeding internal hemorrhoids were found during retroflexion. The  ?     hemorrhoids were Grade II (internal hemorrhoids that prolapse but reduce  ?     spontaneously). ?Impression:            - Non-bleeding internal hemorrhoids. ?                       - No specimens collected. ?Recommendation:        - Discharge patient to home. ?                       - Resume previous diet. ?                       - Continue present medications. ?                       - Repeat colonoscopy in 5 years for surveillance. ?Procedure Code(s):     --- Professional --- ?                       [phone], Colonoscopy, flexible; diagnostic, including  ?                       collection of specimen(s) by brushing or washing, when  ?                       performed (separate procedure) ?Diagnosis Code(s):     --- Professional --- ?                       Z12.11, Encounter for screening for malignant neoplasm  ?                       of colon ?CPT copyright 2019 American [employer] Association. All rights reserved. ?The codes documented in this report are preliminary and upon coder review may  ?  be revised to meet current compliance requirements. ?Lucilla Lame MD, MD ?12/01/2021 9:05:26 AM ?This report has been signed electronically. ?Number of Addenda: 0 ?Note Initiated On: 12/01/2021 8:32 AM ?Scope Withdrawal Time: 0 hours 7 minutes 44 seconds  ?Total Procedure Duration: 0 hours 10 minutes 19 seconds  ?Estimated Blood Loss:  Estimated blood loss: none. ?     [facility] ?

## 2021-12-01 NOTE — Transfer of Care (Signed)
Immediate Anesthesia Transfer of Care Note ? ?Patient: Brianna Phillips ? ?Procedure(s) Performed: COLONOSCOPY (Rectum) ?ESOPHAGOGASTRODUODENOSCOPY (EGD) (Mouth) ?Balloon dilation wire-guided (Mouth) ? ?Patient Location: PACU ? ?Anesthesia Type: General ? ?Level of Consciousness: awake, alert  and patient cooperative ? ?Airway and Oxygen Therapy: Patient Spontanous Breathing and Patient connected to supplemental oxygen ? ?Post-op Assessment: Post-op Vital signs reviewed, Patient's Cardiovascular Status Stable, Respiratory Function Stable, Patent Airway and No signs of Nausea or vomiting ? ?Post-op Vital Signs: Reviewed and stable ? ?Complications: No notable events documented. ? ?

## 2021-12-01 NOTE — Op Note (Signed)
[facility] ?Gastroenterology ?Patient Name: Brianna Phillips ?Procedure Date: 12/01/2021 8:33 AM ?MRN: [id number] ?Account #: [id number] ?Date of Birth: 1981/06/15 ?Admit Type: Outpatient ?Age: 41 ?Room: [facility] OR ROOM 01 ?Gender: Female ?Note Status: Finalized ?Instrument Name: [id number] ?Procedure:             Upper GI endoscopy ?Indications:           Heartburn ?Providers:             Lucilla Lame MD, MD ?Medicines:             Propofol per Anesthesia ?Complications:         No immediate complications. ?Procedure:             Pre-Anesthesia Assessment: ?                       - Prior to the procedure, a History and Physical was  ?                       performed, and patient medications and allergies were  ?                       reviewed. The patient's tolerance of previous  ?                       anesthesia was also reviewed. The risks and benefits  ?                       of the procedure and the sedation options and risks  ?                       were discussed with the patient. All questions were  ?                       answered, and informed consent was obtained. Prior  ?                       Anticoagulants: The patient has taken no previous  ?                       anticoagulant or antiplatelet agents. ASA Grade  ?                       Assessment: II - A patient with mild systemic disease.  ?                       After reviewing the risks and benefits, the patient  ?                       was deemed in satisfactory condition to undergo the  ?                       procedure. ?                       After obtaining informed consent, the endoscope was  ?                       passed under direct vision. Throughout the procedure,  ?  the patient's blood pressure, pulse, and oxygen  ?                       saturations were monitored continuously. The Endoscope  ?                       was introduced through the mouth, and advanced to the  ?                       second part  of duodenum. The upper GI endoscopy was  ?                       accomplished without difficulty. The patient tolerated  ?                       the procedure well. ?Findings: ?     A small hiatal hernia was present. ?     One benign-appearing, intrinsic mild stenosis was found at the  ?     gastroesophageal junction. The stenosis was traversed. A TTS dilator was  ?     passed through the scope. Dilation with a 15-16.5-18 mm balloon dilator  ?     was performed to 18 mm. The dilation site was examined following  ?     endoscope reinsertion and showed complete resolution of luminal  ?     narrowing. ?     Localized minimal inflammation characterized by erythema was found in  ?     the gastric antrum. Biopsies were taken with a cold forceps for  ?     histology. ?     The examined duodenum was normal. ?Impression:            - Small hiatal hernia. ?                       - Benign-appearing esophageal stenosis. Dilated. ?                       - Gastritis. Biopsied. ?                       - Normal examined duodenum. ?Recommendation:        - Discharge patient to home. ?                       - Resume previous diet. ?                       - Perform a colonoscopy today. ?Procedure Code(s):     --- Professional --- ?                       [phone], Esophagogastroduodenoscopy, flexible,  ?                       transoral; with transendoscopic balloon dilation of  ?                       esophagus (less than 30 mm diameter) ?                       [id number], [id number], Esophagogastroduodenoscopy, flexible,  ?  transoral; with biopsy, single or multiple ?Diagnosis Code(s):     --- Professional --- ?                       R12, Heartburn ?                       K22.2, Esophageal obstruction ?                       K29.70, Gastritis, unspecified, without bleeding ?CPT copyright 2019 American Medical Association. All rights reserved. ?The codes documented in this report are preliminary and upon coder review may  ?be  revised to meet current compliance requirements. ?Lucilla Lame MD, MD ?12/01/2021 8:52:02 AM ?This report has been signed electronically. ?Number of Addenda: 0 ?Note Initiated On: 12/01/2021 8:33 AM ?Total Procedure Duration: 0 hours 4 minutes 17 seconds  ?Estimated Blood Loss:  Estimated blood loss: none. ?     [facility] ?

## 2021-12-01 NOTE — Interval H&P Note (Signed)
? ?Lucilla Lame, MD [facility] ?[address] ?[place], [place] [address] ?Phone:[phone] ?Fax : [phone] ? ?Primary Care Physician:  Teodora Medici, DO ?Primary Gastroenterologist:  Dr. Allen Norris ? ?Pre-Procedure History & Physical: ?HPI:  Brianna Phillips is a 41 y.o. female is here for an endoscopy and colonoscopy. ?  ?Past Medical History:  ?Diagnosis Date  ? Cancer [facility])   ? ovarian   ? Complication of anesthesia   ? Family history of breast cancer   ? Family history of ovarian cancer   ? Gastric ulcer   ? PONV (postoperative nausea and vomiting)   ? TMJ (dislocation of temporomandibular joint)   ? ? ?Past Surgical History:  ?Procedure Laterality Date  ? DILATION AND CURETTAGE OF UTERUS    ? FINGER SURGERY    ? FRACTURE SURGERY    ? Left Wrist  ? I & D EXTREMITY  01/23/2012  ? Procedure: IRRIGATION AND DEBRIDEMENT EXTREMITY;  Surgeon: Linna Hoff, MD;  Location: [facility];  Service: Orthopedics;  Laterality: Left;  Left Index Finger  ? joint arthorplasty Left   ? left wrist temporaomandibular  ? ? ?Prior to Admission medications   ?Medication Sig Start Date End Date Taking? Authorizing Provider  ?Azelastine-Fluticasone (DYMISTA) 137-50 MCG/ACT SUSP Place 1 spray into the nose 2 (two) times daily. 10/06/21  Yes Bo Merino, FNP  ?busPIRone (BUSPAR) 15 MG tablet Take 1 tablet (15 mg total) by mouth 2 (two) times daily as needed. 10/06/21  Yes Bo Merino, FNP  ?escitalopram (LEXAPRO) 10 MG tablet Take 1 tablet (10 mg total) by mouth daily. 10/06/21  Yes Bo Merino, FNP  ?levocetirizine (XYZAL) 5 MG tablet Take 1 tablet (5 mg total) by mouth every evening. 10/06/21  Yes Bo Merino, FNP  ?Multiple Vitamins-Minerals (ONE DAILY CALCIUM/IRON PO) Take by mouth.   Yes [provider]  ?pantoprazole (PROTONIX) 40 MG tablet Take 1 tablet (40 mg total) by mouth daily. 10/06/21  Yes Bo Merino, FNP  ?Vitamin D, Ergocalciferol, (DRISDOL) 1.25 MG (50000 UNIT) CAPS capsule Take 1 capsule (50,000  Units total) by mouth every 7 (seven) days. 10/09/21  Yes Bo Merino, FNP  ? ? ?Allergies as of 11/08/2021 - Review Complete 11/08/2021  ?Allergen Reaction Noted  ? Penicillins Hives 01/16/2012  ? Other Swelling and Rash 03/14/2013  ? ? ?Family History  ?Problem Relation Age of Onset  ? Breast cancer Maternal Grandmother   ?     young  ? Breast cancer Maternal Aunt   ?     young  ? Ovarian cancer Maternal Aunt   ? ? ?Social History  ? ?Socioeconomic History  ? Marital status: Divorced  ?  Spouse name: Not on file  ? Number of children: Not on file  ? Years of education: Not on file  ? Highest education level: Not on file  ?Occupational History  ? Not on file  ?Tobacco Use  ? Smoking status: Former  ? Smokeless tobacco: Never  ?Vaping Use  ? Vaping Use: Never used  ?Substance and Sexual Activity  ? Alcohol use: Yes  ?  Comment: Ocassional  ? Drug use: No  ? Sexual activity: Yes  ?Other Topics Concern  ? Not on file  ?Social History Narrative  ? She is only working part time to support her kids emotionally, she lost her 30 yo daughter in 2021 and giving more support to her two living sons  ? ?Social Determinants of Health  ? ?  Financial Resource Strain: Not on file  ?Food Insecurity: Not on file  ?Transportation Needs: Not on file  ?Physical Activity: Not on file  ?Stress: Not on file  ?Social Connections: Not on file  ?Intimate Partner Violence: Not on file  ? ? ?Review of Systems: ?See HPI, otherwise negative ROS ? ?Physical Exam: ?BP (!) 153/97   Pulse 76   Temp 98.7 ?F (37.1 ?C) (Temporal)   Resp 16   Ht '5\' 2"'$  (1.575 m)   Wt 69.9 kg   LMP 11/08/2021   SpO2 99%   BMI 28.17 kg/m?  ?General:   Alert,  pleasant and cooperative in NAD ?Head:  Normocephalic and atraumatic. ?Neck:  Supple; no masses or thyromegaly. ?Lungs:  Clear throughout to auscultation.    ?Heart:  Regular rate and rhythm. ?Abdomen:  Soft, nontender and nondistended. Normal bowel sounds, without guarding, and without rebound.   ?Neurologic:   Alert and  oriented x4;  grossly normal neurologically. ? ?Impression/Plan: ?Brianna Phillips is here for an endoscopy and colonoscopy to be performed for GERD and family history of colon polyps ? ?Risks, benefits, limitations, and alternatives regarding  endoscopy and colonoscopy have been reviewed with the patient.  Questions have been answered.  All parties agreeable. ? ? ?Lucilla Lame, MD  12/01/2021, 7:58 AM ?

## 2021-12-04 ENCOUNTER — Telehealth: Payer: Self-pay | Admitting: Gastroenterology

## 2021-12-04 NOTE — Telephone Encounter (Signed)
Patient states that she is in a lot of pain. Cannot take deep breathes or swallow hard. Patient is very nauseous. Patient requesting a call back from Dr Allen Norris or his nurse. Patient states she is taking PROTONIX 3 times a day rather than once. ?

## 2021-12-04 NOTE — Telephone Encounter (Signed)
I spoke to pt and she reports the acid is worse since having procedure and is having pain epigastric/chest region with radiation to mid-back... I attempted to get more information and to advise pt about ED precautions but she kept cutting me off say that I already have information and snapping at me.... ? ? ?Please advise  ?

## 2021-12-04 NOTE — Telephone Encounter (Signed)
I have scheduled pt for Thurs May 4th... ED precautions given and pt expressed understanding ?

## 2021-12-05 ENCOUNTER — Encounter: Payer: Self-pay | Admitting: Gastroenterology

## 2021-12-05 LAB — SURGICAL PATHOLOGY

## 2021-12-07 ENCOUNTER — Ambulatory Visit: Payer: BC Managed Care – PPO | Admitting: Gastroenterology

## 2021-12-11 ENCOUNTER — Ambulatory Visit
Admission: EM | Admit: 2021-12-11 | Discharge: 2021-12-11 | Disposition: A | Discharge status: Home / Self Care | Payer: BC Managed Care – PPO | Attending: Internal Medicine | Admitting: Internal Medicine

## 2021-12-11 ENCOUNTER — Encounter: Payer: Self-pay | Admitting: Gastroenterology

## 2021-12-11 ENCOUNTER — Ambulatory Visit (INDEPENDENT_AMBULATORY_CARE_PROVIDER_SITE_OTHER): Payer: BC Managed Care – PPO | Admitting: Gastroenterology

## 2021-12-11 ENCOUNTER — Other Ambulatory Visit: Payer: Self-pay

## 2021-12-11 VITALS — BP 171/114 | HR 60 | Temp 98.2°F | Wt 157.0 lb

## 2021-12-11 DIAGNOSIS — K219 Gastro-esophageal reflux disease without esophagitis: Secondary | ICD-10-CM

## 2021-12-11 DIAGNOSIS — R0789 Other chest pain: Secondary | ICD-10-CM

## 2021-12-11 DIAGNOSIS — G44209 Tension-type headache, unspecified, not intractable: Secondary | ICD-10-CM

## 2021-12-11 DIAGNOSIS — R030 Elevated blood-pressure reading, without diagnosis of hypertension: Secondary | ICD-10-CM | POA: Diagnosis not present

## 2021-12-11 MED ORDER — BUTALBITAL-APAP-CAFFEINE 50-325-40 MG PO TABS: 1.0000 | ORAL_TABLET | Freq: Four times a day (QID) | ORAL | 0 refills | Status: AC | PRN

## 2021-12-11 NOTE — Progress Notes (Signed)
? ? ?Primary Care Physician: Teodora Medici, DO ? ?Primary Gastroenterologist:  Dr. Lucilla Lame ? ?Chief Complaint  ?Patient presents with  ? Follow-up  ? ? ?HPI: Brianna Phillips is a 41 y.o. female here for follow-up after having EGD and colonoscopy.  The patient had a history of GERD and a family history of colon polyps therefore was set up for an EGD and colonoscopy.  The patient's colonoscopy did not show any abnormalities except internal hemorrhoids in the upper endoscopy showed some redness that was biopsied and showed mild reactive gastropathy without any gastritis.  The patient also had a hiatal hernia and an esophageal stricture that was dilated.  On May 1 the patient contacted my office saying that her heartburn had gotten much worse and was not being resolved with 3 doses of pantoprazole a day and it started to get worse after the procedures.  The patient was recommended not to take 3 pantoprazole's a day and told to follow-up in the office last week which was canceled by the patient and she rescheduled for today. ? ?Past Medical History:  ?Diagnosis Date  ? Cancer [facility])   ? ovarian   ? Complication of anesthesia   ? Family history of breast cancer   ? Family history of ovarian cancer   ? Gastric ulcer   ? PONV (postoperative nausea and vomiting)   ? TMJ (dislocation of temporomandibular joint)   ? ? ?Current Outpatient Medications  ?Medication Sig Dispense Refill  ? Azelastine-Fluticasone (DYMISTA) 137-50 MCG/ACT SUSP Place 1 spray into the nose 2 (two) times daily. 23 g 3  ? busPIRone (BUSPAR) 15 MG tablet Take 1 tablet (15 mg total) by mouth 2 (two) times daily as needed. 180 tablet 1  ? escitalopram (LEXAPRO) 10 MG tablet Take 1 tablet (10 mg total) by mouth daily. 90 tablet 1  ? levocetirizine (XYZAL) 5 MG tablet Take 1 tablet (5 mg total) by mouth every evening. 30 tablet 3  ? Multiple Vitamins-Minerals (ONE DAILY CALCIUM/IRON PO) Take by mouth.    ? pantoprazole (PROTONIX) 40 MG tablet Take 1  tablet (40 mg total) by mouth daily. 90 tablet 1  ? Vitamin D, Ergocalciferol, (DRISDOL) 1.25 MG (50000 UNIT) CAPS capsule Take 1 capsule (50,000 Units total) by mouth every 7 (seven) days. 8 capsule 0  ? ?No current facility-administered medications for this visit.  ? ? ?Allergies as of 12/11/2021 - Review Complete 12/01/2021  ?Allergen Reaction Noted  ? Penicillins Hives 01/16/2012  ? Other Swelling and Rash 03/14/2013  ? ? ?ROS: ? ?General: Negative for anorexia, weight loss, fever, chills, fatigue, weakness. ?ENT: Negative for hoarseness, difficulty swallowing , nasal congestion. ?CV: Negative for chest pain, angina, palpitations, dyspnea on exertion, peripheral edema.  ?Respiratory: Negative for dyspnea at rest, dyspnea on exertion, cough, sputum, wheezing.  ?GI: See history of present illness. ?GU:  Negative for dysuria, hematuria, urinary incontinence, urinary frequency, nocturnal urination.  ?Endo: Negative for unusual weight change.  ?  ?Physical Examination: ? ? There were no vitals taken for this visit. ? ?General: Well-nourished, well-developed in no acute distress.  ?Eyes: No icterus. Conjunctivae pink. ?Neuro: Alert and oriented x 3.  Grossly intact. ?Skin: Warm and dry, no jaundice.   ?Psych: Alert and cooperative, normal mood and affect. ? ?Labs:  ?  ?Imaging Studies: ?No results found. ? ?Assessment and Plan:  ? ?Brianna Phillips is a 41 y.o. y/o female who reports continued pressure in the lower part of her chest that is  worse with eating.  She denies any burning.  The symptoms are not made any better with her PPI.  The patient has been told that her symptoms are consistent with esophageal spasms and she has been recommended to undergo a esophageal manometry.  The patient has been explained the plan and agrees with it. ? ? ? ? ?Lucilla Lame, MD. [facility] ? ? ? Note: This dictation was prepared with Dragon dictation along with smaller phrase technology. Any transcriptional errors that result from this  process are unintentional.  ?

## 2021-12-11 NOTE — Discharge Instructions (Signed)
Follow up with your primary care doctor if you have 3 or more blood pressure readings of 140/90  ?Make sure to have log if you were stressed that day or slept poorly the night before ?Avoid fast food, energy drinks, sudafed, and too much salt ?

## 2021-12-11 NOTE — ED Provider Notes (Signed)
?[place] ? ? ? ?CSN: [id number] ?Arrival date & time: 12/11/21  1613 ? ? ?  ? ?History   ?Chief Complaint ?Chief Complaint  ?Patient presents with  ? Hypertension  ? ? ?HPI ?Brianna Phillips is a 41 y.o. female who presents with elevated BP which was noted at her ENT visit today. She has been having a HA x 2 hours.  HA is located on upper neck and temples. Admits she is under a lot of stress. Has noticed today she needed her astigmatisms glasses earlier, when normally she does not need them til the afternoon from being in front of the computer at work.  ?She does not drink energy drinks, has low sodium diet.  ? ? ?Past Medical History:  ?Diagnosis Date  ? 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 Complication of anesthesia   ? Family history of breast cancer   ? Family history of ovarian cancer   ? Gastric ulcer   ? PONV (postoperative nausea and vomiting)   ? TMJ (dislocation of temporomandibular joint)   ? ? ?Patient Active Problem List  ? Diagnosis Date Noted  ? Family history of colonic polyps   ? Gastroesophageal reflux disease   ? Stricture and stenosis of esophagus   ? Chronic rhinitis 01/14/2019  ? Dysmenorrhea 01/14/2019  ? Menorrhagia with regular cycle 01/14/2019  ? Fatigue 01/14/2019  ? Stomach ulcer 01/13/2019  ? Family history of breast cancer 01/13/2019  ? ? ?Past Surgical History:  ?Procedure Laterality Date  ? COLONOSCOPY N/A 12/01/2021  ? Procedure: COLONOSCOPY;  Surgeon: Lucilla Lame, MD;  Location: [place];  Service: Endoscopy;  Laterality: N/A;  ? DILATION AND CURETTAGE OF UTERUS    ? ESOPHAGOGASTRODUODENOSCOPY N/A 12/01/2021  ? Procedure: ESOPHAGOGASTRODUODENOSCOPY (EGD);  Surgeon: Lucilla Lame, MD;  Location: [place];  Service: Endoscopy;  Laterality: N/A;  ? FINGER SURGERY    ? FRACTURE SURGERY    ? Left Wrist  ? I & D EXTREMITY  01/23/2012  ? Procedure: IRRIGATION AND DEBRIDEMENT EXTREMITY;  Surgeon: Linna Hoff, MD;  Location: [place];  Service: Orthopedics;   Laterality: Left;  Left Index Finger  ? joint arthorplasty Left   ? left wrist temporaomandibular  ? ? ?OB History   ? ? Gravida  ?5  ? Para  ?3  ? Term  ?   ? Preterm  ?3  ? AB  ?2  ? Living  ?3  ?  ? ? SAB  ?2  ? IAB  ?   ? Ectopic  ?   ? Multiple  ?   ? Live Births  ?3  ?   ?  ?  ? ? ? ?Home Medications   ? ?Prior to Admission medications   ?Medication Sig Start Date End Date Taking? Authorizing Provider  ?butalbital-acetaminophen-caffeine (FIORICET) 50-325-40 MG tablet Take 1 tablet by mouth every 6 (six) hours as needed for headache. 12/11/21 12/11/22 Yes Rodriguez-Southworth, Sunday Spillers, PA-C  ?Azelastine-Fluticasone (DYMISTA) 137-50 MCG/ACT SUSP Place 1 spray into the nose 2 (two) times daily. 10/06/21   Bo Merino, FNP  ?busPIRone (BUSPAR) 15 MG tablet Take 1 tablet (15 mg total) by mouth 2 (two) times daily as needed. 10/06/21   Bo Merino, FNP  ?escitalopram (LEXAPRO) 10 MG tablet Take 1 tablet (10 mg total) by mouth daily. 10/06/21   Bo Merino, FNP  ?levocetirizine (XYZAL) 5 MG tablet Take 1 tablet (5 mg total) by mouth every evening. 10/06/21  Bo Merino, FNP  ?Multiple Vitamins-Minerals (ONE DAILY CALCIUM/IRON PO) Take by mouth.    [provider]  ?pantoprazole (PROTONIX) 40 MG tablet Take 1 tablet (40 mg total) by mouth daily. 10/06/21   Bo Merino, FNP  ?Vitamin D, Ergocalciferol, (DRISDOL) 1.25 MG (50000 UNIT) CAPS capsule Take 1 capsule (50,000 Units total) by mouth every 7 (seven) days. 10/09/21   Bo Merino, FNP  ? ? ?Family History ?Family History  ?Problem Relation Age of Onset  ? Breast cancer Maternal Grandmother   ?     young  ? Breast cancer Maternal Aunt   ?     young  ? Ovarian cancer Maternal Aunt   ? ? ?Social History ?Social History  ? ?Tobacco Use  ? Smoking status: Former  ? Smokeless tobacco: Never  ?Vaping Use  ? Vaping Use: Never used  ?Substance Use Topics  ? Alcohol use: Yes  ?  Comment: Ocassional  ? Drug use: No  ? ? ? ?Allergies   ?Penicillins and  Other ? ? ?Review of Systems ?Review of Systems  ?Constitutional:  Positive for diaphoresis and unexpected weight change. Negative for appetite change and fever.  ?     + hot flushes x 1 month  ?HENT:  Positive for tinnitus.   ?     Sometimes   ?Eyes:  Negative for discharge and visual disturbance.  ?Respiratory:  Negative for chest tightness and shortness of breath.   ?Cardiovascular:  Negative for chest pain and palpitations.  ?Musculoskeletal:  Negative for gait problem.  ?Neurological:  Positive for headaches. Negative for dizziness, syncope, weakness and numbness.  ? ? ?Physical Exam ?Triage Vital Signs ?ED Triage Vitals  ?Enc Vitals Group  ?   BP   ?   Pulse   ?   Resp   ?   Temp   ?   Temp src   ?   SpO2   ?   Weight   ?   Height   ?   Head Circumference   ?   Peak Flow   ?   Pain Score   ?   Pain Loc   ?   Pain Edu?   ?   Excl. in [place]?   ? ?No data found. ? ?Updated Vital Signs ?BP (!) 162/113 (BP Location: Right Arm)   Pulse 73   Temp 98.3 ?F (36.8 ?C) (Oral)   Resp 16   Wt 156 lb 15.5 oz (71.2 kg)   LMP 12/01/2021   SpO2 100%   BMI 28.71 kg/m?  ? ?Visual Acuity ?Right Eye Distance:   ?Left Eye Distance:   ?Bilateral Distance:   ? ?Right Eye Near:   ?Left Eye Near:    ?Bilateral Near:    ? ? ?Physical Exam ?Vitals signs and nursing note reviewed.  ?Constitutional:   ?   General: She is not in acute distress. ?   Appearance: He is well-developed and normal weight. He is not ill-appearing, toxic-appearing or diaphoretic.  ?HENT:  ?   Head:  her temples and upper traps at the occipital insertion are tender. Provokes her HA.  ?Eyes:  ?   Extraocular Movements: Extraocular movements intact.  ?   Pupils: Pupils are equal, round, and reactive to light.  ?Neck:  ?   Musculoskeletal: Neck supple. No neck rigidity.  ?   Meningeal: Brudzinski's sign absent.  ?Cardiovascular:  ?   Rate and Rhythm: Normal rate and regular rhythm.  ?  Heart sounds: No murmur.  ?Pulmonary:  ?   Effort: Pulmonary effort is normal.   ?   Breath sounds: Normal breath sounds. No wheezing, rhonchi or rales.  ?Musculoskeletal: Normal range of motion.  ?Lymphadenopathy:  ?   Cervical: No cervical adenopathy.  ?Skin: ?   General: Skin is warm and dry.  ?Neurological: no face asymmetry noted ?   Mental Status: He is alert.  ?   Cranial Nerves: No cranial nerve deficit or facial asymmetry.  ?   Sensory: No sensory deficit.  ?   Motor: No weakness.  ?   Coordination: Romberg sign negative. Coordination normal.  ?   Gait: Gait normal.  ?   Comments: Normal Romberg, finger to nose, tandem gait.   ?Psychiatric:     ?   Mood and Affect: Mood normal.     ?   Speech: Speech normal.     ?   Behavior: Behavior normal.  ? ? ?UC Treatments / Results  ?Labs ?(all labs ordered are listed, but only abnormal results are displayed) ?Labs Reviewed - No data to display ? ?EKG ?Normal sinus rhythm ?Normal ekg ? ?Radiology ?No results found. ? ?Procedures ?Procedures (including critical care time) ? ?Medications Ordered in UC ?Medications - No data to display ? ?Initial Impression / Assessment and Plan / UC Course  ?I have reviewed the triage vital signs and the nursing notes. ? ?Pertinent labs  results that were available during my care of the patient were reviewed by me and considered in my medical decision making (see chart for details). ? ? ?Elevated BP ?Tension HA ? ?Pt was given the option  to have labs drawn again, though her March ones from this year are normal, vs doing BP diary and if still elevated, Fu with PCP. She opted to do BP diaries and Fu with PCP ? ?I placed her on Fioricet as noted.  ? ? ?Final Clinical Impressions(s) / UC Diagnoses  ? ?Final diagnoses:  ?Elevated blood pressure reading  ? ? ? ?Discharge Instructions   ? ?  ?Follow up with your primary care doctor if you have 3 or more blood pressure readings of 140/90  ?Make sure to have log if you were stressed that day or slept poorly the night before ?Avoid fast food, energy drinks, sudafed, and  too much salt ? ? ? ? ?ED Prescriptions   ? ? Medication Sig Dispense Auth. Provider  ? butalbital-acetaminophen-caffeine (FIORICET) 50-325-40 MG tablet Take 1 tablet by mouth every 6 (six) hours as needed for headache. 12 tab

## 2021-12-11 NOTE — ED Triage Notes (Signed)
Pt was at ENT follow up appointment today. Pt reports headache today for past 2 hours. During her appointment her VS revealed she was hypertensive. No hx of HTN.  ?

## 2021-12-13 NOTE — Addendum Note (Signed)
Addended by: Lurlean Nanny on: 12/13/2021 03:58 PM ? ? Modules accepted: Orders ? ?

## 2021-12-14 ENCOUNTER — Other Ambulatory Visit: Payer: Self-pay

## 2021-12-14 ENCOUNTER — Telehealth: Payer: Self-pay | Admitting: Gastroenterology

## 2021-12-14 DIAGNOSIS — K219 Gastro-esophageal reflux disease without esophagitis: Secondary | ICD-10-CM

## 2021-12-14 DIAGNOSIS — R1310 Dysphagia, unspecified: Secondary | ICD-10-CM

## 2021-12-14 NOTE — Telephone Encounter (Signed)
Referral for esophageal manometry scheduled for 04/18/22 at 10:30 am in [facility]. ?Patient contacted and advised of appointment. Instructions will be mailed. ? ?(Referring provider is Dr Lucilla Lame ) ? ? ?

## 2021-12-18 ENCOUNTER — Encounter: Payer: Self-pay | Admitting: Internal Medicine

## 2021-12-19 ENCOUNTER — Ambulatory Visit: Payer: Self-pay | Admitting: *Deleted

## 2021-12-19 NOTE — Telephone Encounter (Signed)
?  Chief Complaint: Hypertension ?Symptoms: BP181/110 today, headache back of neck, "Feel hot" fatigued ?Frequency: UC 12/11/21  162/113 ?Pertinent Negatives: Patient denies Numbness, weakness ?Disposition: '[]'$ ED /'[]'$ Urgent Care (no appt availability in office) / '[x]'$ Appointment(In office/virtual)/ '[]'$  [place] [facility]/ '[]'$ Home Care/ '[]'$ Refused Recommended Disposition /'[]'$ [place] [facility]/ '[]'$  Follow-up with PCP ?Additional Notes: Appt secured for tomorrow with Dr. Rosana Berger. Care advise provided, pt verbalizes understanding. ?Reason for Disposition ? Systolic BP  >= 496 OR Diastolic >= 759 ? ?Answer Assessment - Initial Assessment Questions ?1. BLOOD PRESSURE: "What is the blood pressure?" "Did you take at least two measurements 5 minutes apart?" ?    181/110 ?2. ONSET: "When did you take your blood pressure?" ?    This AM ?3. HOW: "How did you obtain the blood pressure?" (e.g., visiting nurse, automatic home BP monitor) ?    Home ?4. HISTORY: "Do you have a history of high blood pressure?" ?    no ?5. MEDICATIONS: "Are you taking any medications for blood pressure?" "Have you missed any doses recently?" ?    No meds ?6. OTHER SYMPTOMS: "Do you have any symptoms?" (e.g., headache, chest pain, blurred vision, difficulty breathing, weakness) ?   BAck of neck "Headache" feels hot, fatigued ? ?Protocols used: Blood Pressure - High-A-AH ? ?

## 2021-12-20 ENCOUNTER — Encounter: Payer: Self-pay | Admitting: Internal Medicine

## 2021-12-20 ENCOUNTER — Ambulatory Visit: Payer: BC Managed Care – PPO | Admitting: Internal Medicine

## 2021-12-20 VITALS — BP 142/86 | HR 78 | Temp 98.0°F | Resp 16 | Ht 62.0 in | Wt 158.2 lb

## 2021-12-20 DIAGNOSIS — F419 Anxiety disorder, unspecified: Secondary | ICD-10-CM

## 2021-12-20 DIAGNOSIS — I10 Essential (primary) hypertension: Secondary | ICD-10-CM

## 2021-12-20 MED ORDER — ESCITALOPRAM OXALATE 20 MG PO TABS: 20.0000 mg | ORAL_TABLET | Freq: Every day | ORAL | 0 refills | Status: DC

## 2021-12-20 MED ORDER — HYDROXYZINE HCL 10 MG PO TABS: 10.0000 mg | ORAL_TABLET | Freq: Three times a day (TID) | ORAL | 0 refills | Status: DC | PRN

## 2021-12-20 MED ORDER — LISINOPRIL 2.5 MG PO TABS: 2.5000 mg | ORAL_TABLET | Freq: Every day | ORAL | 1 refills | Status: DC

## 2021-12-20 NOTE — Progress Notes (Signed)
? ?Established Patient Office Visit ? ?Subjective   ?Patient ID: Brianna Phillips, female    DOB: 05/04/1981  Age: 41 y.o. MRN: [id number] ? ?Chief Complaint  ?Patient presents with  ? Hypertension  ? ? ?HPI ?Brianna Phillips is a 41 year old female here for elevated blood pressure. Was seen at ENT on 12/11/21 and had some elevated blood pressures, as high as 171/114. She was then taken to Urgent Care on 12/11/21 for evaluation. She did have a headache at that time, located on upper neck and temples. She did endorse more stress lately. EKG showed normal sinus rhythm. iT was recommend she start checking BP at home and follow up with PCP.  ? ?Elevated Blood Pressure Readings: ?-Medications: None, never has been treated for HTN in the past ?-Checking BP at home (average): has been checking, got 183/115 at home yesterday  ?-Denies any SOB, CP, vision changes, LE edema or symptoms of hypotension. Does endorse headache.  ? ?Admits to feeling more stress in her personal life lately, which coincides with her higher blood pressures. Does not feel like depression symptoms have worsen but does feel anxious at times. Currently on Lexapro 10, Buspar 15 mg BID, which at this time is not controlling her symptoms. She denies side effects from the medications and is compliant.  ? ? ? ?  12/20/2021  ? 11:41 AM 10/13/2021  ? 11:31 AM 10/06/2021  ?  3:47 PM 12/07/2020  ? 10:17 AM 11/16/2019  ?  1:36 PM  ?Depression screen PHQ 2/9  ?Decreased Interest 0 0 0 0 0  ?Down, Depressed, Hopeless 0 0 0 0 0  ?PHQ - 2 Score 0 0 0 0 0  ?Altered sleeping 0 0 _0 ?Tired, decreased energy 0 0 _1 ?Change in appetite 0 0 2 0 0  ?Feeling bad or failure about yourself  0 0 0 0 0  ?Trouble concentrating 0 0 0 0 0  ?Moving slowly or fidgety/restless 0 0 0 0 0  ?Suicidal thoughts 0 0 0 0 0  ?PHQ-9 Score 0 0 _2 ?Difficult doing work/chores Not difficult at all Not difficult at all Somewhat difficult Not difficult at all Not difficult at all  ? ? ? ? ?Review of  Systems  ?Constitutional:  Negative for chills and fever.  ?Eyes:  Negative for blurred vision.  ?Respiratory:  Negative for sputum production.   ?Cardiovascular:  Negative for chest pain.  ?Neurological:  Positive for headaches. Negative for dizziness.  ? ?  ?Objective:  ?  ? ?BP (!) 150/92   Pulse 78   Temp 98 ?F (36.7 ?C)   Resp 16   Ht 5' 2" (1.575 m)   Wt 158 lb 3.2 oz (71.8 kg)   LMP 12/01/2021   BMI 28.94 kg/m?  ?BP Readings from Last 3 Encounters:  ?12/20/21 (!) 150/92  ?12/11/21 (!) 162/113  ?12/11/21 (!) 171/114  ? ?Wt Readings from Last 3 Encounters:  ?12/20/21 158 lb 3.2 oz (71.8 kg)  ?12/11/21 156 lb 15.5 oz (71.2 kg)  ?12/11/21 157 lb (71.2 kg)  ? ?  ? ?Physical Exam ?Constitutional:   ?   Appearance: Normal appearance.  ?HENT:  ?   Head: Normocephalic and atraumatic.  ?   Mouth/Throat:  ?   Mouth: Mucous membranes are moist.  ?   Pharynx: Oropharynx is clear.  ?Eyes:  ?   Extraocular Movements: Extraocular movements intact.  ?   Conjunctiva/sclera: Conjunctivae normal.  ?  Pupils: Pupils are equal, round, and reactive to light.  ?Cardiovascular:  ?   Rate and Rhythm: Normal rate and regular rhythm.  ?Pulmonary:  ?   Effort: Pulmonary effort is normal.  ?   Breath sounds: Normal breath sounds.  ?Musculoskeletal:  ?   Right lower leg: No edema.  ?   Left lower leg: No edema.  ?Skin: ?   General: Skin is warm and dry.  ?Neurological:  ?   General: No focal deficit present.  ?   Mental Status: She is alert. Mental status is at baseline.  ?Psychiatric:     ?   Mood and Affect: Mood normal.     ?   Behavior: Behavior normal.  ? ? ? ?No results found for any visits on 12/20/21. ? ?Last CBC ?Lab Results  ?Component Value Date  ? WBC 5.7 10/06/2021  ? HGB 11.6 (L) 10/06/2021  ? HCT 34.6 (L) 10/06/2021  ? MCV 94.8 10/06/2021  ? MCH 31.8 10/06/2021  ? RDW 11.9 10/06/2021  ? PLT 257 10/06/2021  ? ?Last metabolic panel ?Lab Results  ?Component Value Date  ? GLUCOSE 93 10/06/2021  ? NA 139 10/06/2021  ? K  4.1 10/06/2021  ? CL 104 10/06/2021  ? CO2 27 10/06/2021  ? BUN 13 10/06/2021  ? CREATININE 0.71 10/06/2021  ? EGFR 109 10/06/2021  ? CALCIUM 9.0 10/06/2021  ? PROT 6.9 10/06/2021  ? ALBUMIN 3.5 03/14/2013  ? BILITOT 0.5 10/06/2021  ? ALKPHOS 68 03/14/2013  ? AST 17 10/06/2021  ? ALT 15 10/06/2021  ? ?Last lipids ?Lab Results  ?Component Value Date  ? CHOL 179 10/06/2021  ? HDL 65 10/06/2021  ? [place] 89 10/06/2021  ? TRIG 149 10/06/2021  ? CHOLHDL 2.8 10/06/2021  ? ?Last hemoglobin A1c ?Lab Results  ?Component Value Date  ? HGBA1C 5.1 10/06/2021  ? ?Last thyroid functions ?Lab Results  ?Component Value Date  ? TSH 1.62 10/06/2021  ? ?Last vitamin D ?Lab Results  ?Component Value Date  ? VD25OH 13 (L) 10/06/2021  ? ?Last vitamin B12 and Folate ?Lab Results  ?Component Value Date  ? PVVZSMOL07 331 10/06/2021  ? ?  ? ?The 10-year ASCVD risk score (Arnett DK, et al., 2019) is: 0.5% ? ?  ?Assessment & Plan:  ? ?1. Hypertension, unspecified type: She has had more than 3 blood pressure readings over 140/90 in the last 2 weeks. This could be secondary to stress, which will be addressed as well. She will be treated with Lisinopril 2.5 mg daily, which instructions to continue to monitor blood pressures daily, write these down and return for recheck in 1 month. Potential effects, including hyperkalemia and angioedema, were discussed with the patient. Should she experience swelling on the lips, mouth or throat, she will stop the medication right away and go to the emergency room. If her blood pressure is >200/100 with symptoms including chest pain, shortness of breath, changes in vision or headache, she will also go to the ER. ? ?- lisinopril (ZESTRIL) 2.5 MG tablet; Take 1 tablet (2.5 mg total) by mouth daily.  Dispense: 30 tablet; Refill: 1 ? ?2. Anxiety: Increase Lexapro 20 mg daily, stop Buspar as it is not helping her symptoms at this time. Start Hydroxyzine as needed for anxiety or insomnia.  ? ?- escitalopram (LEXAPRO)  20 MG tablet; Take 1 tablet (20 mg total) by mouth daily.  Dispense: 90 tablet; Refill: 0 ?- hydrOXYzine (ATARAX) 10 MG tablet; Take 1 tablet (10 mg  total) by mouth 3 (three) times daily as needed.  Dispense: 30 tablet; Refill: 0 ? ? ?Return in about 4 weeks (around 01/17/2022).  ? ? ?Teodora Medici, DO ? ?

## 2021-12-20 NOTE — Patient Instructions (Addendum)
It was great seeing you today! ? ?Plan discussed at today's visit: ?-Increase Lexapro to 20 mg, stop Buspar and try Hydroxyzine as needed for anxiety and or insomnia. It can make you sleepy, do not take before you drive. If you have to take it in the day please cut it in half until you know how it affects you ?-Start low dose Lisinopril 2.5 mg  ?-Continue to monitor blood pressure at home, write these down to bring to follow up ?-If you have symptoms including headache, chest pain, vision changes, and blood pressure >200/100 please go to ER  ? ?Follow up in: 1 month  ? ?Take care and let us know if you have any questions or concerns prior to your next visit. ? ?Dr. Rosana Berger ? ?

## 2022-01-15 ENCOUNTER — Other Ambulatory Visit: Payer: Self-pay | Admitting: Internal Medicine

## 2022-01-15 ENCOUNTER — Telehealth: Payer: Self-pay | Admitting: Internal Medicine

## 2022-01-15 DIAGNOSIS — F419 Anxiety disorder, unspecified: Secondary | ICD-10-CM

## 2022-01-16 MED ORDER — HYDROXYZINE PAMOATE 25 MG PO CAPS: 25.0000 mg | ORAL_CAPSULE | Freq: Three times a day (TID) | ORAL | 0 refills | Status: DC | PRN

## 2022-01-16 NOTE — Telephone Encounter (Signed)
Brianna Phillips, specfic Brianna Phillips wants a fu call in re to dose increase of this med. Pls fu to confirm to Brianna Phillips [phone]

## 2022-01-16 NOTE — Telephone Encounter (Signed)
FYI patient wants dose increase?

## 2022-01-16 NOTE — Telephone Encounter (Signed)
No longer current dosing. Requested Prescriptions  Pending Prescriptions Disp Refills  . hydrOXYzine (ATARAX) 10 MG tablet [Pharmacy Med Name: hydrOXYzine HCL 10 MG TABLET] 30 tablet 0    Sig: TAKE ONE TABLET BY MOUTH THREE TIMES A DAY AS NEEDED     Ear, Nose, and Throat:  Antihistamines 2 Passed - 01/15/2022  7:29 PM      Passed - Cr in normal range and within 360 days    Creat  Date Value Ref Range Status  10/06/2021 0.71 0.50 - 0.99 mg/dL Final         Passed - Valid encounter within last 12 months    Recent Outpatient Visits          3 weeks ago Hypertension, unspecified type   [place], DO   3 months ago History of stomach ulcers   [place], DO   3 months ago Weight gain   [facility] Bo Merino, FNP   1 year ago Dysfunction of both eustachian tubes   [facility] Steele Sizer, MD   1 year ago No-show for appointment   [facility] Steele Sizer, MD      Future Appointments            In 2 weeks Teodora Medici, DO [facility], [place]   In 2 months Teodora Medici, [facility], [facility]

## 2022-02-01 NOTE — Progress Notes (Deleted)
 Established Patient Office Visit  Subjective   Patient ID: Brianna Phillips, female    DOB: 04/21/1981  Age: 41 y.o. MRN: [id number]  No chief complaint on file.   HPI Brianna Phillips is a 41 year old female here for elevated blood pressure. Was seen at ENT on 12/11/21 and had some elevated blood pressures, as high as 171/114. She was then taken to Urgent Care on 12/11/21 for evaluation. She did have a headache at that time, located on upper neck and temples. She did endorse more stress lately. EKG showed normal sinus rhythm. iT was recommend she start checking BP at home and follow up with PCP.   Hypertension: -Medications: Lisinopril 2.5 mg -Patient is compliant with above medications and reports no side effects. -Checking BP at home (average): *** -Highest BP at home: *** -Lowest BP at home: *** -Denies any SOB, CP, vision changes, LE edema or symptoms of hypotension -Diet: *** -Exercise: ***  Anxiety: -Currently on Lexapro 20 mg daily (increased at LOV) and Buspar switched to Hydroxyzine PRN -Duration:{Blank single:19197::"controlled","uncontrolled","better","worse","exacerbated","stable"} -Anxious mood: {Blank single:19197::"yes","no"}  -Excessive worrying: {Blank single:19197::"yes","no"} -Irritability: {Blank single:19197::"yes","no"}  -Sweating: {Blank single:19197::"yes","no"} -Nausea: {Blank single:19197::"yes","no"} -Palpitations:{Blank single:19197::"yes","no"} -Hyperventilation: {Blank single:19197::"yes","no"} -Panic attacks: {Blank single:19197::"yes","no"} -Agoraphobia: {Blank single:19197::"yes","no"}  -Obsessions/compulsions: {Blank single:19197::"yes","no"} -Depressed mood: {Blank single:19197::"yes","no"}    12/20/2021   11:41 AM 10/13/2021   11:31 AM 10/06/2021    3:47 PM 12/07/2020   10:17 AM 11/16/2019    1:36 PM  Depression screen PHQ 2/9  Decreased Interest 0 0 0 0 0  Down, Depressed, Hopeless 0 0 0 0 0  PHQ - 2 Score 0 0 0 0 0  Altered sleeping 0 0 2 2 1   Tired, decreased energy 0 0 3 2 2  Change in appetite 0 0 2 0 0  Feeling bad or failure about yourself  0 0 0 0 0  Trouble concentrating 0 0 0 0 0  Moving slowly or fidgety/restless 0 0 0 0 0  Suicidal thoughts 0 0 0 0 0  PHQ-9 Score 0 0 7 4 3  Difficult doing work/chores Not difficult at all Not difficult at all Somewhat difficult Not difficult at all Not difficult at all   -Anhedonia: {Blank single:19197::"yes","no"} -Weight changes: {Blank single:19197::"yes","no"} -Insomnia: {Blank single:19197::"yes","no"} {Blank single:19197::"hard to fall asleep","hard to stay asleep"}  -Hypersomnia: {Blank single:19197::"yes","no"} -Fatigue/loss of energy: {Blank single:19197::"yes","no"} -Feelings of worthlessness: {Blank single:19197::"yes","no"} -Feelings of guilt: {Blank single:19197::"yes","no"} -Impaired concentration/indecisiveness: {Blank single:19197::"yes","no"} -Suicidal ideations: {Blank single:19197::"yes","no"}  -Crying spells: {Blank single:19197::"yes","no"} -Recent Stressors/Life Changes: {Blank single:19197::"yes","no"}   Relationship problems: {Blank single:19197::"yes","no"}   Family stress: {Blank single:19197::"yes","no"}     Financial stress: {Blank single:19197::"yes","no"}    Job stress: {Blank single:19197::"yes","no"}    Recent death/loss: {Blank single:19197::"yes","no"} -Current Treatments: *** -Patient is compliant with the above medications at above dose and reports no side effects. *** -Past Treatments: -Counseling: ***      12/20/2021   11:41 AM 10/13/2021   11:31 AM 10/06/2021    3:47 PM 12/07/2020   10:17 AM 11/16/2019    1:36 PM  Depression screen PHQ 2/9  Decreased Interest 0 0 0 0 0  Down, Depressed, Hopeless 0 0 0 0 0  PHQ - 2 Score 0 0 0 0 0  Altered sleeping 0 0 2 2 1  Tired, decreased energy 0 0 3 2 2  Change in appetite 0 0 2 0 0  Feeling bad or failure about yourself  0 0 0 0 0  Trouble concentrating   0 0 0 0 0  Moving slowly or  fidgety/restless 0 0 0 0 0  Suicidal thoughts 0 0 0 0 0  PHQ-9 Score 0 0 7 4 3  Difficult doing work/chores Not difficult at all Not difficult at all Somewhat difficult Not difficult at all Not difficult at all      Review of Systems  Constitutional:  Negative for chills and fever.  Eyes:  Negative for blurred vision.  Respiratory:  Negative for sputum production.   Cardiovascular:  Negative for chest pain.  Neurological:  Positive for headaches. Negative for dizziness.      Objective:     There were no vitals taken for this visit. BP Readings from Last 3 Encounters:  12/20/21 (!) 142/86  12/11/21 (!) 162/113  12/11/21 (!) 171/114   Wt Readings from Last 3 Encounters:  12/20/21 158 lb 3.2 oz (71.8 kg)  12/11/21 156 lb 15.5 oz (71.2 kg)  12/11/21 157 lb (71.2 kg)      Physical Exam Constitutional:      Appearance: Normal appearance.  HENT:     Head: Normocephalic and atraumatic.     Mouth/Throat:     Mouth: Mucous membranes are moist.     Pharynx: Oropharynx is clear.  Eyes:     Extraocular Movements: Extraocular movements intact.     Conjunctiva/sclera: Conjunctivae normal.     Pupils: Pupils are equal, round, and reactive to light.  Cardiovascular:     Rate and Rhythm: Normal rate and regular rhythm.  Pulmonary:     Effort: Pulmonary effort is normal.     Breath sounds: Normal breath sounds.  Musculoskeletal:     Right lower leg: No edema.     Left lower leg: No edema.  Skin:    General: Skin is warm and dry.  Neurological:     General: No focal deficit present.     Mental Status: She is alert. Mental status is at baseline.  Psychiatric:        Mood and Affect: Mood normal.        Behavior: Behavior normal.      No results found for any visits on 02/02/22.  Last CBC Lab Results  Component Value Date   WBC 5.7 10/06/2021   HGB 11.6 (L) 10/06/2021   HCT 34.6 (L) 10/06/2021   MCV 94.8 10/06/2021   MCH 31.8 10/06/2021   RDW 11.9 10/06/2021    PLT 257 10/06/2021   Last metabolic panel Lab Results  Component Value Date   GLUCOSE 93 10/06/2021   NA 139 10/06/2021   K 4.1 10/06/2021   CL 104 10/06/2021   CO2 27 10/06/2021   BUN 13 10/06/2021   CREATININE 0.71 10/06/2021   EGFR 109 10/06/2021   CALCIUM 9.0 10/06/2021   PROT 6.9 10/06/2021   ALBUMIN 3.5 03/14/2013   BILITOT 0.5 10/06/2021   ALKPHOS 68 03/14/2013   AST 17 10/06/2021   ALT 15 10/06/2021   Last lipids Lab Results  Component Value Date   CHOL 179 10/06/2021   HDL 65 10/06/2021   LDLCALC 89 10/06/2021   TRIG 149 10/06/2021   CHOLHDL 2.8 10/06/2021   Last hemoglobin A1c Lab Results  Component Value Date   HGBA1C 5.1 10/06/2021   Last thyroid functions Lab Results  Component Value Date   TSH 1.62 10/06/2021   Last vitamin D Lab Results  Component Value Date   VD25OH 13 (L) 10/06/2021   Last vitamin B12 and Folate Lab Results    Component Value Date   VITAMINB12 331 10/06/2021      The 10-year ASCVD risk score (Arnett DK, et al., 2019) is: 0.7%    Assessment & Plan:   1. Hypertension, unspecified type: She has had more than 3 blood pressure readings over 140/90 in the last 2 weeks. This could be secondary to stress, which will be addressed as well. She will be treated with Lisinopril 2.5 mg daily, which instructions to continue to monitor blood pressures daily, write these down and return for recheck in 1 month. Potential effects, including hyperkalemia and angioedema, were discussed with the patient. Should she experience swelling on the lips, mouth or throat, she will stop the medication right away and go to the emergency room. If her blood pressure is >200/100 with symptoms including chest pain, shortness of breath, changes in vision or headache, she will also go to the ER.  - lisinopril (ZESTRIL) 2.5 MG tablet; Take 1 tablet (2.5 mg total) by mouth daily.  Dispense: 30 tablet; Refill: 1  2. Anxiety: Increase Lexapro 20 mg daily, stop  Buspar as it is not helping her symptoms at this time. Start Hydroxyzine as needed for anxiety or insomnia.   - escitalopram (LEXAPRO) 20 MG tablet; Take 1 tablet (20 mg total) by mouth daily.  Dispense: 90 tablet; Refill: 0 - hydrOXYzine (ATARAX) 10 MG tablet; Take 1 tablet (10 mg total) by mouth 3 (three) times daily as needed.  Dispense: 30 tablet; Refill: 0   No follow-ups on file.    Elisabeth Andrews, DO  

## 2022-02-02 ENCOUNTER — Ambulatory Visit: Payer: BC Managed Care – PPO | Admitting: Internal Medicine

## 2022-02-03 ENCOUNTER — Telehealth: Payer: Self-pay | Admitting: Internal Medicine

## 2022-02-03 DIAGNOSIS — F419 Anxiety disorder, unspecified: Secondary | ICD-10-CM

## 2022-02-05 ENCOUNTER — Telehealth: Payer: Self-pay | Admitting: Internal Medicine

## 2022-02-05 DIAGNOSIS — F419 Anxiety disorder, unspecified: Secondary | ICD-10-CM

## 2022-02-05 MED ORDER — HYDROXYZINE PAMOATE 25 MG PO CAPS: ORAL_CAPSULE | ORAL | 0 refills | Status: DC

## 2022-02-05 NOTE — Telephone Encounter (Signed)
Hydroxyzine 25 mg as needed for anxiety refilled.

## 2022-02-05 NOTE — Telephone Encounter (Signed)
Rx 01/16/22 #30- too soon Requested Prescriptions  Pending Prescriptions Disp Refills  . hydrOXYzine (VISTARIL) 25 MG capsule [Pharmacy Med Name: hydrOXYzine PAM 25 MG CAP] 30 capsule 0    Sig: TAKE 1 CAPSULE BY MOUTH EVERY 8 HOURS AS NEEDED     Ear, Nose, and Throat:  Antihistamines 2 Passed - 02/03/2022  5:24 PM      Passed - Cr in normal range and within 360 days    Creat  Date Value Ref Range Status  10/06/2021 0.71 0.50 - 0.99 mg/dL Final         Passed - Valid encounter within last 12 months    Recent Outpatient Visits          1 month ago Hypertension, unspecified type   [place], DO   3 months ago History of stomach ulcers   [place], DO   4 months ago Weight gain   [place], FNP   1 year ago Dysfunction of both eustachian tubes   [facility] Steele Sizer, MD   1 year ago No-show for appointment   [facility] Steele Sizer, MD      Future Appointments            In 2 months Teodora Medici, [facility], [facility]

## 2022-02-14 ENCOUNTER — Other Ambulatory Visit: Payer: Self-pay | Admitting: Internal Medicine

## 2022-02-14 DIAGNOSIS — I10 Essential (primary) hypertension: Secondary | ICD-10-CM

## 2022-02-14 NOTE — Telephone Encounter (Signed)
Requested Prescriptions  Pending Prescriptions Disp Refills  . lisinopril (ZESTRIL) 2.5 MG tablet [Pharmacy Med Name: LISINOPRIL 2.5 MG TABLET] 30 tablet 1    Sig: TAKE 1 TABLET BY MOUTH DAILY     Cardiovascular:  ACE Inhibitors Failed - 02/14/2022  6:23 AM      Failed - Last BP in normal range    BP Readings from Last 1 Encounters:  12/20/21 (!) 142/86         Passed - Cr in normal range and within 180 days    Creat  Date Value Ref Range Status  10/06/2021 0.71 0.50 - 0.99 mg/dL Final         Passed - K in normal range and within 180 days    Potassium  Date Value Ref Range Status  10/06/2021 4.1 3.5 - 5.3 mmol/L Final         Passed - Patient is not pregnant      Passed - Valid encounter within last 6 months    Recent Outpatient Visits          1 month ago Hypertension, unspecified type   [place], DO   4 months ago History of stomach ulcers   [place], DO   4 months ago Weight gain   [place], FNP   1 year ago Dysfunction of both eustachian tubes   [facility] Steele Sizer, MD   1 year ago No-show for appointment   [facility] Steele Sizer, MD      Future Appointments            In 1 month Teodora Medici, [facility], [facility]

## 2022-03-06 ENCOUNTER — Encounter: Payer: Self-pay | Admitting: Emergency Medicine

## 2022-03-06 ENCOUNTER — Other Ambulatory Visit: Payer: Self-pay

## 2022-03-06 ENCOUNTER — Emergency Department
Admission: EM | Admit: 2022-03-06 | Discharge: 2022-03-06 | Disposition: A | Discharge status: Home / Self Care | Payer: No Typology Code available for payment source | Attending: Emergency Medicine | Admitting: Emergency Medicine

## 2022-03-06 ENCOUNTER — Emergency Department: Payer: No Typology Code available for payment source

## 2022-03-06 DIAGNOSIS — R519 Headache, unspecified: Secondary | ICD-10-CM | POA: Diagnosis present

## 2022-03-06 DIAGNOSIS — Y92410 Unspecified street and highway as the place of occurrence of the external cause: Secondary | ICD-10-CM | POA: Diagnosis not present

## 2022-03-06 DIAGNOSIS — M25512 Pain in left shoulder: Secondary | ICD-10-CM | POA: Diagnosis not present

## 2022-03-06 DIAGNOSIS — M542 Cervicalgia: Secondary | ICD-10-CM | POA: Diagnosis not present

## 2022-03-06 HISTORY — DX: Anxiety disorder, unspecified: F41.9

## 2022-03-06 MED ORDER — CYCLOBENZAPRINE HCL 10 MG PO TABS: 10.0000 mg | ORAL_TABLET | Freq: Three times a day (TID) | ORAL | 0 refills | Status: AC | PRN

## 2022-03-06 MED ORDER — OXYCODONE HCL 5 MG PO TABS: 5.0000 mg | ORAL_TABLET | Freq: Four times a day (QID) | ORAL | 0 refills | Status: AC | PRN
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
## 2022-03-06 NOTE — Discharge Instructions (Signed)
-  May take cyclobenzaprine as needed for muscle relaxation.  Use caution as it may make you drowsy.  Use oxycodone sparingly.  -Return to the emergency department anytime if you begin to experience any new or worsening symptoms.
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
## 2022-03-06 NOTE — ED Notes (Signed)
Pt given DC papers and f/u instructions at this time.  Pt states understanding of instruction.  NAD noted on discharge, and pt ambulatory to exit.
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
## 2022-03-19 ENCOUNTER — Other Ambulatory Visit: Payer: Self-pay | Admitting: Internal Medicine

## 2022-03-19 DIAGNOSIS — F419 Anxiety disorder, unspecified: Secondary | ICD-10-CM

## 2022-03-20 NOTE — Telephone Encounter (Signed)
Requested Prescriptions  Pending Prescriptions Disp Refills  . hydrOXYzine (VISTARIL) 25 MG capsule [Pharmacy Med Name: hydrOXYzine PAM 25 MG CAP] 30 capsule 0    Sig: TAKE 1 CAPSULE BY MOUTH EVERY 8 HOURS AS NEEDED     Ear, Nose, and Throat:  Antihistamines 2 Passed - 03/19/2022 10:01 PM      Passed - Cr in normal range and within 360 days    Creat  Date Value Ref Range Status  10/06/2021 0.71 0.50 - 0.99 mg/dL Final         Passed - Valid encounter within last 12 months    Recent Outpatient Visits          3 months ago Hypertension, unspecified type   [place], DO   5 months ago History of stomach ulcers   [place], DO   5 months ago Weight gain   [place], FNP   1 year ago Dysfunction of both eustachian tubes   [facility] Steele Sizer, MD   1 year ago No-show for appointment   [facility] Steele Sizer, MD      Future Appointments            In 3 weeks Teodora Medici, [facility], [facility]

## 2022-04-11 ENCOUNTER — Encounter (HOSPITAL_COMMUNITY): Payer: Self-pay | Admitting: Gastroenterology

## 2022-04-11 NOTE — Progress Notes (Signed)
Attempted to obtain medical history via telephone, unable to reach at this time. HIPAA compliant voicemail message left requesting return call to pre surgical testing department. 

## 2022-04-13 ENCOUNTER — Ambulatory Visit: Payer: BC Managed Care – PPO | Admitting: Internal Medicine

## 2022-04-17 ENCOUNTER — Telehealth: Payer: Self-pay | Admitting: Gastroenterology

## 2022-04-17 NOTE — Telephone Encounter (Signed)
Spoke with the patient. She declines to reschedule at this time. Central Scheduling notified.

## 2022-04-17 NOTE — Telephone Encounter (Signed)
Inbound call from patient stating she needs to cancel upcoming procedure at 10:30 on 9/13 at [facility] due to her father having heart surgery. Please give a call to further advise.  Thank you

## 2022-04-18 ENCOUNTER — Ambulatory Visit (HOSPITAL_COMMUNITY)
Admission: RE | Admit: 2022-04-18 | Payer: BC Managed Care – PPO | Source: Home / Self Care | Admitting: Gastroenterology

## 2022-04-18 ENCOUNTER — Encounter (HOSPITAL_COMMUNITY): Admission: RE | Payer: Self-pay | Source: Home / Self Care

## 2022-04-18 SURGERY — MANOMETRY, ESOPHAGUS
Anesthesia: Choice

## 2022-04-20 ENCOUNTER — Telehealth: Payer: Self-pay | Admitting: Internal Medicine

## 2022-04-20 ENCOUNTER — Telehealth: Payer: Self-pay | Admitting: Nurse Practitioner

## 2022-04-20 ENCOUNTER — Ambulatory Visit (INDEPENDENT_AMBULATORY_CARE_PROVIDER_SITE_OTHER): Payer: Self-pay | Admitting: Nurse Practitioner

## 2022-04-20 ENCOUNTER — Other Ambulatory Visit: Payer: Self-pay

## 2022-04-20 ENCOUNTER — Encounter: Payer: Self-pay | Admitting: Nurse Practitioner

## 2022-04-20 VITALS — BP 144/76 | HR 100 | Temp 98.2°F | Resp 18 | Ht 62.0 in | Wt 159.4 lb

## 2022-04-20 DIAGNOSIS — F419 Anxiety disorder, unspecified: Secondary | ICD-10-CM

## 2022-04-20 DIAGNOSIS — I10 Essential (primary) hypertension: Secondary | ICD-10-CM

## 2022-04-20 MED ORDER — LISINOPRIL 10 MG PO TABS
10.0000 mg | ORAL_TABLET | Freq: Every day | ORAL | 0 refills | Status: DC
Start: 2022-04-20 — End: 2022-05-21

## 2022-04-20 NOTE — Telephone Encounter (Signed)
Duplicate request. Requested Prescriptions  Pending Prescriptions Disp Refills  . lisinopril (ZESTRIL) 2.5 MG tablet [Pharmacy Med Name: LISINOPRIL 2.5 MG TABLET] 30 tablet 1    Sig: TAKE 1 TABLET BY MOUTH DAILY     Cardiovascular:  ACE Inhibitors Failed - 04/20/2022  6:22 AM      Failed - Cr in normal range and within 180 days    Creat  Date Value Ref Range Status  10/06/2021 0.71 0.50 - 0.99 mg/dL Final         Failed - K in normal range and within 180 days    Potassium  Date Value Ref Range Status  10/06/2021 4.1 3.5 - 5.3 mmol/L Final         Failed - Last BP in normal range    BP Readings from Last 1 Encounters:  04/20/22 (!) 144/76         Passed - Patient is not pregnant      Passed - Valid encounter within last 6 months    Recent Outpatient Visits          Today Hypertension, unspecified type   [facility] [place], FNP   4 months ago Hypertension, unspecified type   [place], DO   6 months ago History of stomach ulcers   [place], DO   6 months ago Weight gain   [place], FNP   1 year ago Dysfunction of both eustachian tubes   [facility] Steele Sizer, MD      Future Appointments            In 4 weeks Teodora Medici, [facility], [facility]
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
## 2022-04-20 NOTE — Telephone Encounter (Signed)
Dose inconsistent with current med list. Requested Prescriptions  Pending Prescriptions Disp Refills  . escitalopram (LEXAPRO) 10 MG tablet [Pharmacy Med Name: ESCITALOPRAM 10 MG TABLET] 90 tablet 1    Sig: TAKE ONE TABLET BY MOUTH DAILY     Psychiatry:  Antidepressants - SSRI Passed - 04/20/2022  6:22 AM      Passed - Valid encounter within last 6 months    Recent Outpatient Visits          Today Hypertension, unspecified type   [place], FNP   4 months ago Hypertension, unspecified type   [place], DO   6 months ago History of stomach ulcers   [place], DO   6 months ago Weight gain   [place], FNP   1 year ago Dysfunction of both eustachian tubes   [facility] Steele Sizer, MD      Future Appointments            In 4 weeks Teodora Medici, [facility], [facility]

## 2022-04-20 NOTE — Assessment & Plan Note (Signed)
Increase lisinopril 2.5 to 10 mg daily.  Keep b/p log and send via mychart and bring to follow up appointment in 4 weeks.

## 2022-04-21 LAB — COMPLETE METABOLIC PANEL WITH GFR
AG Ratio: 1.7 (calc) (ref 1.0–2.5)
ALT: 19 U/L (ref 6–29)
AST: 16 U/L (ref 10–30)
Albumin: 4.2 g/dL (ref 3.6–5.1)
Alkaline phosphatase (APISO): 58 U/L (ref 31–125)
BUN: 8 mg/dL (ref 7–25)
CO2: 24 mmol/L (ref 20–32)
Calcium: 9.2 mg/dL (ref 8.6–10.2)
Chloride: 104 mmol/L (ref 98–110)
Creat: 0.73 mg/dL (ref 0.50–0.99)
Globulin: 2.5 g/dL (calc) (ref 1.9–3.7)
Glucose, Bld: 101 mg/dL — ABNORMAL HIGH (ref 65–99)
Potassium: 4.3 mmol/L (ref 3.5–5.3)
Sodium: 139 mmol/L (ref 135–146)
Total Bilirubin: 0.4 mg/dL (ref 0.2–1.2)
Total Protein: 6.7 g/dL (ref 6.1–8.1)
eGFR: 105 mL/min/{1.73_m2} (ref >=60)

## 2022-04-23 ENCOUNTER — Other Ambulatory Visit: Payer: Self-pay

## 2022-04-23 DIAGNOSIS — F419 Anxiety disorder, unspecified: Secondary | ICD-10-CM

## 2022-04-23 MED ORDER — ESCITALOPRAM OXALATE 20 MG PO TABS: 20.0000 mg | ORAL_TABLET | Freq: Every day | ORAL | 0 refills | Status: DC

## 2022-04-25 ENCOUNTER — Encounter: Payer: Self-pay | Admitting: Nurse Practitioner

## 2022-05-07 ENCOUNTER — Ambulatory Visit: Payer: Self-pay | Admitting: Internal Medicine

## 2022-05-07 NOTE — Progress Notes (Deleted)
 Established Patient Office Visit  Subjective   Patient ID: Brianna Phillips, female    DOB: 08/13/1980  Age: 41 y.o. MRN: [id number]  No chief complaint on file.   HPI Brianna Phillips is a 41 year old female here for follow up on blood pressure.    Hypertension: -Medications: Lisinopril increased to 10 mg 4 weeks ago -Checking BP at home (average): has been checking, got 183/115 at home yesterday  -Denies any SOB, CP, vision changes, LE edema or symptoms of hypotension. Does endorse headache.   Admits to feeling more stress in her personal life lately, which coincides with her higher blood pressures. Does not feel like depression symptoms have worsen but does feel anxious at times. Currently on Lexapro 20, Buspar 15 mg BID, which at this time is not controlling her symptoms. She denies side effects from the medications and is compliant.       04/20/2022    3:51 PM 12/20/2021   11:41 AM 10/13/2021   11:31 AM 10/06/2021    3:47 PM 12/07/2020   10:17 AM  Depression screen PHQ 2/9  Decreased Interest 0 0 0 0 0  Down, Depressed, Hopeless 0 0 0 0 0  PHQ - 2 Score 0 0 0 0 0  Altered sleeping 0 0 0 2 2  Tired, decreased energy 0 0 0 3 2  Change in appetite 0 0 0 2 0  Feeling bad or failure about yourself  0 0 0 0 0  Trouble concentrating 0 0 0 0 0  Moving slowly or fidgety/restless 0 0 0 0 0  Suicidal thoughts 0 0 0 0 0  PHQ-9 Score 0 0 0 7 4  Difficult doing work/chores Not difficult at all Not difficult at all Not difficult at all Somewhat difficult Not difficult at all      Review of Systems  Constitutional:  Negative for chills and fever.  Eyes:  Negative for blurred vision.  Respiratory:  Negative for sputum production.   Cardiovascular:  Negative for chest pain.  Neurological:  Positive for headaches. Negative for dizziness.      Objective:     There were no vitals taken for this visit. BP Readings from Last 3 Encounters:  04/20/22 (!) 144/76  12/20/21 (!) 142/86   12/11/21 (!) 162/113   Wt Readings from Last 3 Encounters:  04/20/22 159 lb 6.4 oz (72.3 kg)  12/20/21 158 lb 3.2 oz (71.8 kg)  12/11/21 156 lb 15.5 oz (71.2 kg)      Physical Exam Constitutional:      Appearance: Normal appearance.  HENT:     Head: Normocephalic and atraumatic.     Mouth/Throat:     Mouth: Mucous membranes are moist.     Pharynx: Oropharynx is clear.  Eyes:     Extraocular Movements: Extraocular movements intact.     Conjunctiva/sclera: Conjunctivae normal.     Pupils: Pupils are equal, round, and reactive to light.  Cardiovascular:     Rate and Rhythm: Normal rate and regular rhythm.  Pulmonary:     Effort: Pulmonary effort is normal.     Breath sounds: Normal breath sounds.  Musculoskeletal:     Right lower leg: No edema.     Left lower leg: No edema.  Skin:    General: Skin is warm and dry.  Neurological:     General: No focal deficit present.     Mental Status: She is alert. Mental status is at baseline.  Psychiatric:          Mood and Affect: Mood normal.        Behavior: Behavior normal.      No results found for any visits on 05/07/22.  Last CBC Lab Results  Component Value Date   WBC 5.7 10/06/2021   HGB 11.6 (L) 10/06/2021   HCT 34.6 (L) 10/06/2021   MCV 94.8 10/06/2021   MCH 31.8 10/06/2021   RDW 11.9 10/06/2021   PLT 257 83/04/4075   Last metabolic panel Lab Results  Component Value Date   GLUCOSE 101 (H) 04/20/2022   NA 139 04/20/2022   K 4.3 04/20/2022   CL 104 04/20/2022   CO2 24 04/20/2022   BUN 8 04/20/2022   CREATININE 0.73 04/20/2022   EGFR 105 04/20/2022   CALCIUM 9.2 04/20/2022   PROT 6.7 04/20/2022   ALBUMIN 3.5 03/14/2013   BILITOT 0.4 04/20/2022   ALKPHOS 68 03/14/2013   AST 16 04/20/2022   ALT 19 04/20/2022   Last lipids Lab Results  Component Value Date   CHOL 179 10/06/2021   HDL 65 10/06/2021   LDLCALC 89 10/06/2021   TRIG 149 10/06/2021   CHOLHDL 2.8 10/06/2021   Last hemoglobin A1c Lab  Results  Component Value Date   HGBA1C 5.1 10/06/2021   Last thyroid functions Lab Results  Component Value Date   TSH 1.62 10/06/2021   Last vitamin D Lab Results  Component Value Date   VD25OH 13 (L) 10/06/2021   Last vitamin B12 and Folate Lab Results  Component Value Date   VITAMINB12 331 10/06/2021      The 10-year ASCVD risk score (Arnett DK, et al., 2019) is: 0.7%    Assessment & Plan:   1. Hypertension, unspecified type: She has had more than 3 blood pressure readings over 140/90 in the last 2 weeks. This could be secondary to stress, which will be addressed as well. She will be treated with Lisinopril 2.5 mg daily, which instructions to continue to monitor blood pressures daily, write these down and return for recheck in 1 month. Potential effects, including hyperkalemia and angioedema, were discussed with the patient. Should she experience swelling on the lips, mouth or throat, she will stop the medication right away and go to the emergency room. If her blood pressure is >200/100 with symptoms including chest pain, shortness of breath, changes in vision or headache, she will also go to the ER.  - lisinopril (ZESTRIL) 2.5 MG tablet; Take 1 tablet (2.5 mg total) by mouth daily.  Dispense: 30 tablet; Refill: 1  2. Anxiety: Increase Lexapro 20 mg daily, stop Buspar as it is not helping her symptoms at this time. Start Hydroxyzine as needed for anxiety or insomnia.   - escitalopram (LEXAPRO) 20 MG tablet; Take 1 tablet (20 mg total) by mouth daily.  Dispense: 90 tablet; Refill: 0 - hydrOXYzine (ATARAX) 10 MG tablet; Take 1 tablet (10 mg total) by mouth 3 (three) times daily as needed.  Dispense: 30 tablet; Refill: 0   No follow-ups on file.    Teodora Medici, DO

## 2022-05-14 ENCOUNTER — Ambulatory Visit: Payer: Self-pay | Admitting: Internal Medicine

## 2022-05-14 ENCOUNTER — Encounter: Payer: Self-pay | Admitting: Internal Medicine

## 2022-05-14 NOTE — Progress Notes (Deleted)
Established Patient Office Visit  Subjective   Patient ID: Brianna Phillips, female    DOB: 03-20-1981  Age: 41 y.o. MRN: [id number]  No chief complaint on file.   HPI Sama Arauz is a 41 year old female here for follow up on blood pressure.    Hypertension: -Medications: Lisinopril increased to 10 mg 4 weeks ago -Checking BP at home (average): has been checking, got 183/115 at home yesterday  -Denies any SOB, CP, vision changes, LE edema or symptoms of hypotension. Does endorse headache.   Admits to feeling more stress in her personal life lately, which coincides with her higher blood pressures. Does not feel like depression symptoms have worsen but does feel anxious at times. Currently on Lexapro 20, Buspar 15 mg BID, which at this time is not controlling her symptoms. She denies side effects from the medications and is compliant.       04/20/2022    3:51 PM 12/20/2021   11:41 AM 10/13/2021   11:31 AM 10/06/2021    3:47 PM 12/07/2020   10:17 AM  Depression screen PHQ 2/9  Decreased Interest 0 0 0 0 0  Down, Depressed, Hopeless 0 0 0 0 0  PHQ - 2 Score 0 0 0 0 0  Altered sleeping 0 0 0 2 2  Tired, decreased energy 0 0 0 3 2  Change in appetite 0 0 0 2 0  Feeling bad or failure about yourself  0 0 0 0 0  Trouble concentrating 0 0 0 0 0  Moving slowly or fidgety/restless 0 0 0 0 0  Suicidal thoughts 0 0 0 0 0  PHQ-9 Score 0 0 0 7 4  Difficult doing work/chores Not difficult at all Not difficult at all Not difficult at all Somewhat difficult Not difficult at all      Review of Systems  Constitutional:  Negative for chills and fever.  Eyes:  Negative for blurred vision.  Respiratory:  Negative for sputum production.   Cardiovascular:  Negative for chest pain.  Neurological:  Positive for headaches. Negative for dizziness.      Objective:     There were no vitals taken for this visit. BP Readings from Last 3 Encounters:  04/20/22 (!) 144/76  12/20/21 (!) 142/86   12/11/21 (!) 162/113   Wt Readings from Last 3 Encounters:  04/20/22 159 lb 6.4 oz (72.3 kg)  12/20/21 158 lb 3.2 oz (71.8 kg)  12/11/21 156 lb 15.5 oz (71.2 kg)      Physical Exam Constitutional:      Appearance: Normal appearance.  HENT:     Head: Normocephalic and atraumatic.     Mouth/Throat:     Mouth: Mucous membranes are moist.     Pharynx: Oropharynx is clear.  Eyes:     Extraocular Movements: Extraocular movements intact.     Conjunctiva/sclera: Conjunctivae normal.     Pupils: Pupils are equal, round, and reactive to light.  Cardiovascular:     Rate and Rhythm: Normal rate and regular rhythm.  Pulmonary:     Effort: Pulmonary effort is normal.     Breath sounds: Normal breath sounds.  Musculoskeletal:     Right lower leg: No edema.     Left lower leg: No edema.  Skin:    General: Skin is warm and dry.  Neurological:     General: No focal deficit present.     Mental Status: She is alert. Mental status is at baseline.  Psychiatric:  Mood and Affect: Mood normal.        Behavior: Behavior normal.      No results found for any visits on 05/14/22.  Last CBC Lab Results  Component Value Date   WBC 5.7 10/06/2021   HGB 11.6 (L) 10/06/2021   HCT 34.6 (L) 10/06/2021   MCV 94.8 10/06/2021   MCH 31.8 10/06/2021   RDW 11.9 10/06/2021   PLT 257 10/06/2021   Last metabolic panel Lab Results  Component Value Date   GLUCOSE 101 (H) 04/20/2022   NA 139 04/20/2022   K 4.3 04/20/2022   CL 104 04/20/2022   CO2 24 04/20/2022   BUN 8 04/20/2022   CREATININE 0.73 04/20/2022   EGFR 105 04/20/2022   CALCIUM 9.2 04/20/2022   PROT 6.7 04/20/2022   ALBUMIN 3.5 03/14/2013   BILITOT 0.4 04/20/2022   ALKPHOS 68 03/14/2013   AST 16 04/20/2022   ALT 19 04/20/2022   Last lipids Lab Results  Component Value Date   CHOL 179 10/06/2021   HDL 65 10/06/2021   LDLCALC 89 10/06/2021   TRIG 149 10/06/2021   CHOLHDL 2.8 10/06/2021   Last hemoglobin A1c Lab  Results  Component Value Date   HGBA1C 5.1 10/06/2021   Last thyroid functions Lab Results  Component Value Date   TSH 1.62 10/06/2021   Last vitamin D Lab Results  Component Value Date   VD25OH 13 (L) 10/06/2021   Last vitamin B12 and Folate Lab Results  Component Value Date   VITAMINB12 331 10/06/2021      The 10-year ASCVD risk score (Arnett DK, et al., 2019) is: 0.7%    Assessment & Plan:   1. Hypertension, unspecified type: She has had more than 3 blood pressure readings over 140/90 in the last 2 weeks. This could be secondary to stress, which will be addressed as well. She will be treated with Lisinopril 2.5 mg daily, which instructions to continue to monitor blood pressures daily, write these down and return for recheck in 1 month. Potential effects, including hyperkalemia and angioedema, were discussed with the patient. Should she experience swelling on the lips, mouth or throat, she will stop the medication right away and go to the emergency room. If her blood pressure is >200/100 with symptoms including chest pain, shortness of breath, changes in vision or headache, she will also go to the ER.  - lisinopril (ZESTRIL) 2.5 MG tablet; Take 1 tablet (2.5 mg total) by mouth daily.  Dispense: 30 tablet; Refill: 1  2. Anxiety: Increase Lexapro 20 mg daily, stop Buspar as it is not helping her symptoms at this time. Start Hydroxyzine as needed for anxiety or insomnia.   - escitalopram (LEXAPRO) 20 MG tablet; Take 1 tablet (20 mg total) by mouth daily.  Dispense: 90 tablet; Refill: 0 - hydrOXYzine (ATARAX) 10 MG tablet; Take 1 tablet (10 mg total) by mouth 3 (three) times daily as needed.  Dispense: 30 tablet; Refill: 0   No follow-ups on file.    Elisabeth Andrews, DO  

## 2022-05-18 ENCOUNTER — Ambulatory Visit: Payer: Self-pay | Admitting: Internal Medicine

## 2022-05-19 ENCOUNTER — Other Ambulatory Visit: Payer: Self-pay | Admitting: Nurse Practitioner

## 2022-05-19 DIAGNOSIS — I10 Essential (primary) hypertension: Secondary | ICD-10-CM

## 2022-05-21 NOTE — Telephone Encounter (Signed)
Requested Prescriptions  Pending Prescriptions Disp Refills  . lisinopril (ZESTRIL) 10 MG tablet [Pharmacy Med Name: LISINOPRIL 10 MG TABLET] 30 tablet 0    Sig: TAKE 1 TABLET BY MOUTH DAILY     Cardiovascular:  ACE Inhibitors Failed - 05/19/2022  6:52 AM      Failed - Last BP in normal range    BP Readings from Last 1 Encounters:  04/20/22 (!) 144/76         Passed - Cr in normal range and within 180 days    Creat  Date Value Ref Range Status  04/20/2022 0.73 0.50 - 0.99 mg/dL Final         Passed - K in normal range and within 180 days    Potassium  Date Value Ref Range Status  04/20/2022 4.3 3.5 - 5.3 mmol/L Final         Passed - Patient is not pregnant      Passed - Valid encounter within last 6 months    Recent Outpatient Visits          1 month ago Hypertension, unspecified type   [place], FNP   5 months ago Hypertension, unspecified type   [place], DO   7 months ago History of stomach ulcers   [place], DO   7 months ago Weight gain   [place], FNP   1 year ago Dysfunction of both eustachian tubes   [facility] Steele Sizer, MD

## 2022-10-22 ENCOUNTER — Other Ambulatory Visit: Payer: Self-pay | Admitting: Internal Medicine

## 2022-10-22 DIAGNOSIS — F419 Anxiety disorder, unspecified: Secondary | ICD-10-CM

## 2022-10-23 NOTE — Telephone Encounter (Signed)
Attempted to call patient to schedule follow up appointment- left message to call office- 30 day courtesy RF given Requested Prescriptions  Pending Prescriptions Disp Refills   escitalopram (LEXAPRO) 20 MG tablet [Pharmacy Med Name: ESCITALOPRAM 20 MG TABLET] 90 tablet 0    Sig: TAKE 1 TABLET BY MOUTH DAILY     Psychiatry:  Antidepressants - SSRI Failed - 10/22/2022  6:22 AM      Failed - Valid encounter within last 6 months    Recent Outpatient Visits           6 months ago Hypertension, unspecified type   [place], FNP   10 months ago Hypertension, unspecified type   [place], DO   1 year ago History of stomach ulcers   [facility] Teodora Medici, DO   1 year ago Weight gain   [facility] Bo Merino, FNP   1 year ago Dysfunction of both eustachian tubes   [facility] Steele Sizer, MD

## 2022-11-07 ENCOUNTER — Other Ambulatory Visit: Payer: Self-pay | Admitting: Internal Medicine

## 2022-11-07 DIAGNOSIS — F419 Anxiety disorder, unspecified: Secondary | ICD-10-CM

## 2022-11-07 MED ORDER — ESCITALOPRAM OXALATE 20 MG PO TABS: 20.0000 mg | ORAL_TABLET | Freq: Every day | ORAL | 0 refills | Status: DC

## 2022-11-07 NOTE — Telephone Encounter (Signed)
Patient called, left VM to return the call to the office to schedule OV. Noted to return on 05/18/22 for f/u with Dr. Rosana Berger, 2 canceled appts, will need scheduling if she returns the call. Will send a MyChart message to call and schedule. Will send in a 10 day supply to last until she returns home from vacation.  Requested Prescriptions  Pending Prescriptions Disp Refills   escitalopram (LEXAPRO) 20 MG tablet 30 tablet 0    Sig: Take 1 tablet (20 mg total) by mouth daily.     Psychiatry:  Antidepressants - SSRI Failed - 11/07/2022  2:57 PM      Failed - Valid encounter within last 6 months    Recent Outpatient Visits           6 months ago Hypertension, unspecified type   [place], FNP   10 months ago Hypertension, unspecified type   [facility] Teodora Medici, DO   1 year ago History of stomach ulcers   [facility] Teodora Medici, DO   1 year ago Weight gain   [facility] Bo Merino, FNP   1 year ago Dysfunction of both eustachian tubes   [facility] Steele Sizer, MD

## 2022-11-07 NOTE — Telephone Encounter (Signed)
Medication Refill - Medication: escitalopram (LEXAPRO) 20 MG tablet   Has the patient contacted their pharmacy? No.  She is out of town and this pharmacy has never filled it before  Preferred Pharmacy (with phone number or street name): [place], [place] - [address] Phone: [phone]  Fax: [phone]   Has the patient been seen for an appointment in the last year OR does the patient have an upcoming appointment? Yes.    The patient called stating [place] is at the beach and has forgot once of her medicines that she needs to take daily. She has been without it for a few days and will still be there for a few more. Please assist patient further

## 2022-11-13 ENCOUNTER — Other Ambulatory Visit: Payer: Self-pay | Admitting: Emergency Medicine

## 2022-11-13 DIAGNOSIS — J310 Chronic rhinitis: Secondary | ICD-10-CM
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
## 2022-11-16 ENCOUNTER — Encounter: Payer: Self-pay | Admitting: Internal Medicine

## 2022-11-16 ENCOUNTER — Ambulatory Visit: Payer: Self-pay | Admitting: Internal Medicine

## 2022-11-16 VITALS — BP 114/70 | HR 90 | Temp 97.7°F | Resp 18 | Ht 62.0 in | Wt 154.3 lb

## 2022-11-16 DIAGNOSIS — J310 Chronic rhinitis: Secondary | ICD-10-CM

## 2022-11-16 DIAGNOSIS — F419 Anxiety disorder, unspecified: Secondary | ICD-10-CM

## 2022-11-16 DIAGNOSIS — I10 Essential (primary) hypertension: Secondary | ICD-10-CM

## 2022-11-16 MED ORDER — ESCITALOPRAM OXALATE 20 MG PO TABS: 20.0000 mg | ORAL_TABLET | Freq: Every day | ORAL | 1 refills | Status: DC

## 2022-11-16 MED ORDER — BUPROPION HCL ER (XL) 150 MG PO TB24: 150.0000 mg | ORAL_TABLET | Freq: Every day | ORAL | 1 refills | Status: DC

## 2022-11-16 MED ORDER — AZELASTINE-FLUTICASONE 137-50 MCG/ACT NA SUSP: 1.0000 | Freq: Two times a day (BID) | NASAL | 3 refills | Status: DC

## 2022-11-16 MED ORDER — LISINOPRIL-HYDROCHLOROTHIAZIDE 20-12.5 MG PO TABS: 1.0000 | ORAL_TABLET | Freq: Every day | ORAL | 1 refills | Status: DC

## 2022-12-03 ENCOUNTER — Other Ambulatory Visit: Payer: Self-pay

## 2022-12-03 ENCOUNTER — Encounter (HOSPITAL_COMMUNITY): Payer: Self-pay

## 2022-12-03 ENCOUNTER — Emergency Department (HOSPITAL_COMMUNITY)
Admission: EM | Admit: 2022-12-03 | Discharge: 2022-12-04 | Discharge status: Against Medical Advice | Payer: Self-pay | Attending: Emergency Medicine | Admitting: Emergency Medicine

## 2022-12-03 DIAGNOSIS — M549 Dorsalgia, unspecified: Secondary | ICD-10-CM | POA: Insufficient documentation

## 2022-12-03 DIAGNOSIS — R109 Unspecified abdominal pain: Secondary | ICD-10-CM | POA: Insufficient documentation

## 2022-12-03 DIAGNOSIS — Z5321 Procedure and treatment not carried out due to patient leaving prior to being seen by health care provider: Secondary | ICD-10-CM | POA: Insufficient documentation

## 2022-12-03 NOTE — ED Triage Notes (Signed)
Patient arrived POV from home with pain that wraps all the way around patients abdomen and back. Reports pain is intermittent, sharp pain.   Reports pain makes it difficult to breath normally when pain is present.
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
## 2022-12-26 ENCOUNTER — Emergency Department: Payer: Self-pay

## 2022-12-26 ENCOUNTER — Encounter
Admission: EM | Disposition: A | Discharge status: Home / Self Care | Payer: Self-pay | Source: Home / Self Care | Attending: Family Medicine

## 2022-12-26 ENCOUNTER — Inpatient Hospital Stay
Admission: EM | Admit: 2022-12-26 | Discharge: 2022-12-27 | DRG: 419 | Disposition: A | Discharge status: Home / Self Care | Payer: Self-pay | Attending: Internal Medicine | Admitting: Internal Medicine

## 2022-12-26 ENCOUNTER — Encounter: Payer: Self-pay | Admitting: Emergency Medicine

## 2022-12-26 ENCOUNTER — Inpatient Hospital Stay: Payer: Self-pay

## 2022-12-26 ENCOUNTER — Other Ambulatory Visit: Payer: Self-pay

## 2022-12-26 ENCOUNTER — Inpatient Hospital Stay: Payer: Self-pay | Admitting: Certified Registered"

## 2022-12-26 DIAGNOSIS — R1010 Upper abdominal pain, unspecified: Secondary | ICD-10-CM

## 2022-12-26 DIAGNOSIS — K819 Cholecystitis, unspecified: Secondary | ICD-10-CM | POA: Diagnosis not present

## 2022-12-26 DIAGNOSIS — R7401 Elevation of levels of liver transaminase levels: Secondary | ICD-10-CM

## 2022-12-26 DIAGNOSIS — K219 Gastro-esophageal reflux disease without esophagitis: Secondary | ICD-10-CM | POA: Diagnosis present

## 2022-12-26 DIAGNOSIS — Z880 Allergy status to penicillin: Secondary | ICD-10-CM

## 2022-12-26 DIAGNOSIS — R1011 Right upper quadrant pain: Secondary | ICD-10-CM | POA: Diagnosis present

## 2022-12-26 DIAGNOSIS — Z87891 Personal history of nicotine dependence: Secondary | ICD-10-CM

## 2022-12-26 DIAGNOSIS — Z8711 Personal history of peptic ulcer disease: Secondary | ICD-10-CM

## 2022-12-26 DIAGNOSIS — K828 Other specified diseases of gallbladder: Secondary | ICD-10-CM | POA: Diagnosis present

## 2022-12-26 DIAGNOSIS — K810 Acute cholecystitis: Principal | ICD-10-CM | POA: Diagnosis present

## 2022-12-26 DIAGNOSIS — I10 Essential (primary) hypertension: Secondary | ICD-10-CM | POA: Diagnosis present

## 2022-12-26 DIAGNOSIS — K759 Inflammatory liver disease, unspecified: Secondary | ICD-10-CM | POA: Diagnosis present

## 2022-12-26 HISTORY — DX: Cholecystitis, unspecified: K81.9

## 2022-12-26 LAB — COMPREHENSIVE METABOLIC PANEL
ALT: 151 U/L — ABNORMAL HIGH (ref 0–44)
AST: 428 U/L — ABNORMAL HIGH (ref 15–41)
Albumin: 3.8 g/dL (ref 3.5–5.0)
Alkaline Phosphatase: 90 U/L (ref 38–126)
Anion gap: 9 (ref 5–15)
BUN: 11 mg/dL (ref 6–20)
CO2: 27 mmol/L (ref 22–32)
Calcium: 9 mg/dL (ref 8.9–10.3)
Chloride: 101 mmol/L (ref 98–111)
Creatinine, Ser: 0.73 mg/dL (ref 0.44–1.00)
GFR, Estimated: >60 mL/min (ref >60)
Glucose, Bld: 124 mg/dL — ABNORMAL HIGH (ref 70–99)
Potassium: 3.5 mmol/L (ref 3.5–5.1)
Sodium: 137 mmol/L (ref 135–145)
Total Bilirubin: 0.9 mg/dL (ref 0.3–1.2)
Total Protein: 7.3 g/dL (ref 6.5–8.1)

## 2022-12-26 LAB — CBC WITH DIFFERENTIAL/PLATELET
Abs Immature Granulocytes: 0.03 10*3/uL (ref 0.00–0.07)
Basophils Absolute: 0 10*3/uL (ref 0.0–0.1)
Basophils Relative: 0 %
Eosinophils Absolute: 0.1 10*3/uL (ref 0.0–0.5)
Eosinophils Relative: 1 %
HCT: 37.6 % (ref 36.0–46.0)
Hemoglobin: 12.5 g/dL (ref 12.0–15.0)
Immature Granulocytes: 0 %
Lymphocytes Relative: 15 %
Lymphs Abs: 1.3 10*3/uL (ref 0.7–4.0)
MCH: 31.6 pg (ref 26.0–34.0)
MCHC: 33.2 g/dL (ref 30.0–36.0)
MCV: 95.2 fL (ref 80.0–100.0)
Monocytes Absolute: 1 10*3/uL (ref 0.1–1.0)
Monocytes Relative: 12 %
Neutro Abs: 6.4 10*3/uL (ref 1.7–7.7)
Neutrophils Relative %: 72 %
Platelets: 284 10*3/uL (ref 150–400)
RBC: 3.95 MIL/uL (ref 3.87–5.11)
RDW: 13.2 % (ref 11.5–15.5)
WBC: 8.9 10*3/uL (ref 4.0–10.5)
nRBC: 0 % (ref 0.0–0.2)

## 2022-12-26 LAB — HCG, QUANTITATIVE, PREGNANCY: hCG, Beta Chain, Quant, S: <1 m[IU]/mL (ref <5)

## 2022-12-26 LAB — URINALYSIS, ROUTINE W REFLEX MICROSCOPIC
Bilirubin Urine: NEGATIVE
Glucose, UA: NEGATIVE mg/dL
Hgb urine dipstick: NEGATIVE
Ketones, ur: NEGATIVE mg/dL
Leukocytes,Ua: NEGATIVE
Nitrite: NEGATIVE
Protein, ur: NEGATIVE mg/dL
Specific Gravity, Urine: 1.045 — ABNORMAL HIGH (ref 1.005–1.030)
pH: 8 (ref 5.0–8.0)

## 2022-12-26 LAB — LIPASE, BLOOD: Lipase: 92 U/L — ABNORMAL HIGH (ref 11–51)

## 2022-12-26 LAB — HEPATITIS PANEL, ACUTE
HCV Ab: NONREACTIVE
Hep A IgM: NONREACTIVE
Hep B C IgM: NONREACTIVE
Hepatitis B Surface Ag: NONREACTIVE

## 2022-12-26 LAB — ACETAMINOPHEN LEVEL: Acetaminophen (Tylenol), Serum: 19 ug/mL (ref 10–30)

## 2022-12-26 SURGERY — CHOLECYSTECTOMY, ROBOT-ASSISTED, LAPAROSCOPIC
Anesthesia: General | Site: Abdomen

## 2022-12-26 MED ORDER — FENTANYL CITRATE (PF) 100 MCG/2ML IJ SOLN
INTRAMUSCULAR | Status: DC | PRN
  Administered 2022-12-26 (×2): 50 ug via INTRAVENOUS

## 2022-12-26 MED ORDER — HYDROMORPHONE HCL 1 MG/ML IJ SOLN
INTRAMUSCULAR | Status: DC | PRN
  Administered 2022-12-26: .5 mg via INTRAVENOUS

## 2022-12-26 MED ORDER — LACTATED RINGERS IV SOLN: INTRAVENOUS | Status: DC | PRN

## 2022-12-26 MED ORDER — DEXAMETHASONE SODIUM PHOSPHATE 10 MG/ML IJ SOLN
INTRAMUSCULAR | Status: DC | PRN
  Administered 2022-12-26: 5 mg via INTRAVENOUS

## 2022-12-26 MED ORDER — OXYCODONE HCL 5 MG/5ML PO SOLN: 5.0000 mg | Freq: Once | ORAL | Status: AC | PRN

## 2022-12-26 MED ORDER — SODIUM CHLORIDE 0.9 % IV SOLN: INTRAVENOUS | Status: DC

## 2022-12-26 MED ORDER — BUPIVACAINE LIPOSOME 1.3 % IJ SUSP
INTRAMUSCULAR | Status: AC
  Filled 2022-12-26: qty 20

## 2022-12-26 MED ORDER — OXYCODONE HCL 5 MG PO TABS
5.0000 mg | ORAL_TABLET | Freq: Once | ORAL | Status: AC | PRN
  Administered 2022-12-26: 5 mg via ORAL

## 2022-12-26 MED ORDER — PANTOPRAZOLE SODIUM 40 MG IV SOLR
40.0000 mg | Freq: Once | INTRAVENOUS | Status: AC
  Administered 2022-12-26: 40 mg via INTRAVENOUS
  Filled 2022-12-26: qty 10

## 2022-12-26 MED ORDER — DEXMEDETOMIDINE HCL IN NACL 200 MCG/50ML IV SOLN
INTRAVENOUS | Status: DC | PRN
  Administered 2022-12-26 (×2): 4 ug via INTRAVENOUS

## 2022-12-26 MED ORDER — CETIRIZINE HCL 10 MG PO TABS
10.0000 mg | ORAL_TABLET | Freq: Every evening | ORAL | Status: DC
  Administered 2022-12-26: 10 mg via ORAL
  Filled 2022-12-26: qty 1

## 2022-12-26 MED ORDER — HYDROMORPHONE HCL 1 MG/ML IJ SOLN
0.5000 mg | Freq: Once | INTRAMUSCULAR | Status: AC
  Administered 2022-12-26: 0.5 mg via INTRAVENOUS

## 2022-12-26 MED ORDER — INDOCYANINE GREEN 25 MG IV SOLR
2.5000 mg | INTRAVENOUS | Status: AC
  Administered 2022-12-26: 2.5 mg via INTRAVENOUS
  Filled 2022-12-26: qty 1

## 2022-12-26 MED ORDER — PROPOFOL 10 MG/ML IV BOLUS
INTRAVENOUS | Status: AC
  Filled 2022-12-26: qty 40

## 2022-12-26 MED ORDER — BUPROPION HCL ER (XL) 150 MG PO TB24
150.0000 mg | ORAL_TABLET | Freq: Every day | ORAL | Status: DC
  Administered 2022-12-26: 150 mg via ORAL
  Filled 2022-12-26 (×2): qty 1

## 2022-12-26 MED ORDER — 0.9 % SODIUM CHLORIDE (POUR BTL) OPTIME
TOPICAL | Status: DC | PRN
  Administered 2022-12-26: 500 mL

## 2022-12-26 MED ORDER — MIDAZOLAM HCL 2 MG/2ML IJ SOLN
INTRAMUSCULAR | Status: AC
  Filled 2022-12-26: qty 2

## 2022-12-26 MED ORDER — FENTANYL CITRATE (PF) 100 MCG/2ML IJ SOLN
INTRAMUSCULAR | Status: AC
  Filled 2022-12-26: qty 2

## 2022-12-26 MED ORDER — MORPHINE SULFATE (PF) 4 MG/ML IV SOLN
4.0000 mg | Freq: Once | INTRAVENOUS | Status: AC
  Administered 2022-12-26: 4 mg via INTRAVENOUS
  Filled 2022-12-26: qty 1

## 2022-12-26 MED ORDER — PROPOFOL 10 MG/ML IV BOLUS
INTRAVENOUS | Status: DC | PRN
  Administered 2022-12-26: 50 ug/kg/min via INTRAVENOUS
  Administered 2022-12-26: 170 mg via INTRAVENOUS

## 2022-12-26 MED ORDER — KETOROLAC TROMETHAMINE 30 MG/ML IJ SOLN
INTRAMUSCULAR | Status: DC | PRN
  Administered 2022-12-26: 15 mg via INTRAVENOUS

## 2022-12-26 MED ORDER — EPINEPHRINE PF 1 MG/ML IJ SOLN
INTRAMUSCULAR | Status: AC
  Filled 2022-12-26: qty 1

## 2022-12-26 MED ORDER — OXYCODONE-ACETAMINOPHEN 5-325 MG PO TABS
1.0000 | ORAL_TABLET | ORAL | Status: DC | PRN
  Administered 2022-12-27 (×2): 2 via ORAL
  Filled 2022-12-26 (×2): qty 2

## 2022-12-26 MED ORDER — IOHEXOL 300 MG/ML  SOLN
100.0000 mL | Freq: Once | INTRAMUSCULAR | Status: AC | PRN
  Administered 2022-12-26: 100 mL via INTRAVENOUS

## 2022-12-26 MED ORDER — MORPHINE SULFATE (PF) 4 MG/ML IV SOLN
4.0000 mg | INTRAVENOUS | Status: DC | PRN
  Administered 2022-12-26 – 2022-12-27 (×7): 4 mg via INTRAVENOUS
  Filled 2022-12-26 (×7): qty 1

## 2022-12-26 MED ORDER — ONDANSETRON HCL 4 MG/2ML IJ SOLN
INTRAMUSCULAR | Status: DC | PRN
  Administered 2022-12-26: 4 mg via INTRAVENOUS

## 2022-12-26 MED ORDER — GLYCOPYRROLATE 0.2 MG/ML IJ SOLN
INTRAMUSCULAR | Status: DC | PRN
  Administered 2022-12-26: .2 mg via INTRAVENOUS

## 2022-12-26 MED ORDER — ENOXAPARIN SODIUM 40 MG/0.4ML IJ SOSY: 40.0000 mg | PREFILLED_SYRINGE | INTRAMUSCULAR | Status: DC

## 2022-12-26 MED ORDER — ACETAMINOPHEN 10 MG/ML IV SOLN
INTRAVENOUS | Status: DC | PRN
  Administered 2022-12-26: 1000 mg via INTRAVENOUS

## 2022-12-26 MED ORDER — PHENYLEPHRINE HCL-NACL 20-0.9 MG/250ML-% IV SOLN
INTRAVENOUS | Status: AC
  Filled 2022-12-26: qty 250

## 2022-12-26 MED ORDER — LIDOCAINE HCL (CARDIAC) PF 100 MG/5ML IV SOSY
PREFILLED_SYRINGE | INTRAVENOUS | Status: DC | PRN
  Administered 2022-12-26: 50 mg via INTRAVENOUS

## 2022-12-26 MED ORDER — OXYCODONE HCL 5 MG PO TABS
ORAL_TABLET | ORAL | Status: AC
  Filled 2022-12-26: qty 1

## 2022-12-26 MED ORDER — BUPIVACAINE-EPINEPHRINE (PF) 0.25% -1:200000 IJ SOLN
INTRAMUSCULAR | Status: DC | PRN
  Administered 2022-12-26: 20 mL

## 2022-12-26 MED ORDER — LIDOCAINE VISCOUS HCL 2 % MT SOLN
15.0000 mL | Freq: Once | OROMUCOSAL | Status: AC
  Administered 2022-12-26: 15 mL via ORAL
  Filled 2022-12-26: qty 15

## 2022-12-26 MED ORDER — ROCURONIUM BROMIDE 100 MG/10ML IV SOLN
INTRAVENOUS | Status: DC | PRN
  Administered 2022-12-26: 50 mg via INTRAVENOUS
  Administered 2022-12-26: 200 mg via INTRAVENOUS

## 2022-12-26 MED ORDER — NEOSTIGMINE METHYLSULFATE 10 MG/10ML IV SOLN: INTRAVENOUS | Status: DC | PRN

## 2022-12-26 MED ORDER — SODIUM CHLORIDE 0.9 % IV BOLUS
1000.0000 mL | Freq: Once | INTRAVENOUS | Status: AC
  Administered 2022-12-26: 1000 mL via INTRAVENOUS

## 2022-12-26 MED ORDER — ONDANSETRON HCL 4 MG PO TABS: 4.0000 mg | ORAL_TABLET | Freq: Four times a day (QID) | ORAL | Status: DC | PRN

## 2022-12-26 MED ORDER — MIDAZOLAM HCL 2 MG/2ML IJ SOLN
INTRAMUSCULAR | Status: DC | PRN
  Administered 2022-12-26: 2 mg via INTRAVENOUS

## 2022-12-26 MED ORDER — ESCITALOPRAM OXALATE 10 MG PO TABS
20.0000 mg | ORAL_TABLET | Freq: Every day | ORAL | Status: DC
  Administered 2022-12-26: 20 mg via ORAL
  Filled 2022-12-26: qty 2

## 2022-12-26 MED ORDER — FENTANYL CITRATE (PF) 100 MCG/2ML IJ SOLN
25.0000 ug | INTRAMUSCULAR | Status: DC | PRN
  Administered 2022-12-26 (×4): 25 ug via INTRAVENOUS

## 2022-12-26 MED ORDER — HYDROMORPHONE HCL 1 MG/ML IJ SOLN
INTRAMUSCULAR | Status: AC
  Filled 2022-12-26: qty 1

## 2022-12-26 MED ORDER — MELATONIN 5 MG PO TABS
5.0000 mg | ORAL_TABLET | Freq: Once | ORAL | Status: AC
  Administered 2022-12-27: 5 mg via ORAL
  Filled 2022-12-26: qty 1

## 2022-12-26 MED ORDER — ALUM & MAG HYDROXIDE-SIMETH 200-200-20 MG/5ML PO SUSP
30.0000 mL | Freq: Once | ORAL | Status: AC
  Administered 2022-12-26: 30 mL via ORAL
  Filled 2022-12-26: qty 30

## 2022-12-26 MED ORDER — ONDANSETRON HCL 4 MG/2ML IJ SOLN
4.0000 mg | Freq: Four times a day (QID) | INTRAMUSCULAR | Status: DC | PRN
  Administered 2022-12-26: 4 mg via INTRAVENOUS
  Filled 2022-12-26: qty 2

## 2022-12-26 MED ORDER — PHENYLEPHRINE HCL (PRESSORS) 10 MG/ML IV SOLN
INTRAVENOUS | Status: DC | PRN
  Administered 2022-12-26: 160 ug via INTRAVENOUS
  Administered 2022-12-26 (×3): 80 ug via INTRAVENOUS
  Administered 2022-12-26 (×2): 160 ug via INTRAVENOUS

## 2022-12-26 MED ORDER — DIPHENHYDRAMINE HCL 50 MG/ML IJ SOLN: 12.5000 mg | Freq: Once | INTRAMUSCULAR | Status: DC | PRN

## 2022-12-26 MED ORDER — GADOBUTROL 1 MMOL/ML IV SOLN
6.0000 mL | Freq: Once | INTRAVENOUS | Status: AC | PRN
  Administered 2022-12-26: 6 mL via INTRAVENOUS

## 2022-12-26 MED ORDER — BUPIVACAINE HCL (PF) 0.25 % IJ SOLN
INTRAMUSCULAR | Status: AC
  Filled 2022-12-26: qty 30

## 2022-12-26 MED ORDER — CIPROFLOXACIN IN D5W 400 MG/200ML IV SOLN
400.0000 mg | Freq: Two times a day (BID) | INTRAVENOUS | Status: DC
  Administered 2022-12-26 – 2022-12-27 (×2): 400 mg via INTRAVENOUS
  Filled 2022-12-26 (×2): qty 200

## 2022-12-26 MED ORDER — ACETAMINOPHEN 10 MG/ML IV SOLN
INTRAVENOUS | Status: AC
  Filled 2022-12-26: qty 100

## 2022-12-26 MED ORDER — ONDANSETRON HCL 4 MG/2ML IJ SOLN
4.0000 mg | Freq: Once | INTRAMUSCULAR | Status: AC
  Administered 2022-12-26: 4 mg via INTRAVENOUS
  Filled 2022-12-26: qty 2

## 2022-12-26 SURGICAL SUPPLY — 46 items
ADH SKN CLS APL DERMABOND .7 (GAUZE/BANDAGES/DRESSINGS) ×2
BAG PRESSURE INF REUSE 3000 (BAG) IMPLANT
CLIP LIGATING HEM O LOK PURPLE (MISCELLANEOUS) ×2 IMPLANT
COVER TIP SHEARS 8 DVNC (MISCELLANEOUS) ×2 IMPLANT
DERMABOND ADVANCED .7 DNX12 (GAUZE/BANDAGES/DRESSINGS) ×2 IMPLANT
DRAPE ARM DVNC X/XI (DISPOSABLE) ×8 IMPLANT
DRAPE COLUMN DVNC XI (DISPOSABLE) ×2 IMPLANT
ELECT CAUTERY BLADE 6.4 (BLADE) ×2 IMPLANT
FORCEPS BPLR R/ABLATION 8 DVNC (INSTRUMENTS) ×2 IMPLANT
FORCEPS PROGRASP DVNC XI (FORCEP) ×2 IMPLANT
GLOVE ORTHO TXT STRL SZ7.5 (GLOVE) ×4 IMPLANT
GOWN STRL REUS W/ TWL LRG LVL3 (GOWN DISPOSABLE) ×4 IMPLANT
GOWN STRL REUS W/ TWL XL LVL3 (GOWN DISPOSABLE) ×4 IMPLANT
GOWN STRL REUS W/TWL LRG LVL3 (GOWN DISPOSABLE) ×4
GOWN STRL REUS W/TWL XL LVL3 (GOWN DISPOSABLE) ×4
GRASPER SUT TROCAR 14GX15 (MISCELLANEOUS) ×2 IMPLANT
IRRIGATION STRYKERFLOW (MISCELLANEOUS) IMPLANT
IRRIGATOR STRYKERFLOW (MISCELLANEOUS)
IRRIGATOR SUCT 8 DISP DVNC XI (IRRIGATION / IRRIGATOR) IMPLANT
IV NS IRRIG 3000ML ARTHROMATIC (IV SOLUTION) IMPLANT
KIT PINK PAD W/HEAD ARE REST (MISCELLANEOUS) ×2 IMPLANT
KIT PINK PAD W/HEAD ARM REST (MISCELLANEOUS) ×2 IMPLANT
KIT TURNOVER KIT A (KITS) ×2 IMPLANT
LABEL OR SOLS (LABEL) ×2 IMPLANT
MANIFOLD NEPTUNE II (INSTRUMENTS) ×2 IMPLANT
NDL HYPO 22X1.5 SAFETY MO (MISCELLANEOUS) ×2 IMPLANT
NDL INSUFFLATION 14GA 120MM (NEEDLE) ×2 IMPLANT
NEEDLE HYPO 22X1.5 SAFETY MO (MISCELLANEOUS) ×2 IMPLANT
NEEDLE INSUFFLATION 14GA 120MM (NEEDLE) ×2 IMPLANT
NS IRRIG 500ML POUR BTL (IV SOLUTION) ×2 IMPLANT
PACK LAP CHOLECYSTECTOMY (MISCELLANEOUS) ×2 IMPLANT
SCISSORS MNPLR CVD DVNC XI (INSTRUMENTS) ×2 IMPLANT
SEAL UNIV 5-12 XI (MISCELLANEOUS) ×8 IMPLANT
SET TUBE SMOKE EVAC HIGH FLOW (TUBING) ×2 IMPLANT
SOL ELECTROSURG ANTI STICK (MISCELLANEOUS) ×2
SOLUTION ELECTROSURG ANTI STCK (MISCELLANEOUS) ×2 IMPLANT
SPIKE FLUID TRANSFER (MISCELLANEOUS) ×2 IMPLANT
SUT MNCRL 4-0 (SUTURE) ×4
SUT MNCRL 4-0 27XMFL (SUTURE) ×4
SUT VICRYL 0 UR6 27IN ABS (SUTURE) ×2 IMPLANT
SUTURE MNCRL 4-0 27XMF (SUTURE) ×2 IMPLANT
SYS BAG RETRIEVAL 10MM (BASKET) ×2
SYSTEM BAG RETRIEVAL 10MM (BASKET) ×2 IMPLANT
TRAP FLUID SMOKE EVACUATOR (MISCELLANEOUS) ×2 IMPLANT
TROCAR Z-THREAD FIOS 11X100 BL (TROCAR) ×2 IMPLANT
WATER STERILE IRR 500ML POUR (IV SOLUTION) ×2 IMPLANT

## 2022-12-26 NOTE — ED Notes (Signed)
Patient transported to MRI 

## 2022-12-26 NOTE — Interval H&P Note (Signed)
History and Physical Interval Note:  12/26/2022 4:38 PM  Brianna Phillips  has presented today for surgery, with the diagnosis of Acalculous Cholecystitis.  The various methods of treatment have been discussed with the patient and family. After consideration of risks, benefits and other options for treatment, the patient has consented to  Procedure(s): XI ROBOTIC ASSISTED LAPAROSCOPIC CHOLECYSTECTOMY (N/A) as a surgical intervention.  The patient's history has been reviewed, patient examined, no change in status, stable for surgery.  I have reviewed the patient's chart and labs.  Questions were answered to the patient's satisfaction.     [name]

## 2022-12-26 NOTE — Assessment & Plan Note (Signed)
AST 428/ALT 151  T bili/ALP WNL  Noted concern for cholecystitis vs. Choledocholithiasis  Hepatitis panel pending  Tylenol level

## 2022-12-26 NOTE — Op Note (Signed)
Robotic cholecystectomy with Indocyamine Green Ductal Imaging.   Pre-operative Diagnosis: Acalculus cholecystitis  Post-operative Diagnosis:  Same.  Procedure: Robotic assisted laparoscopic cholecystectomy with Indocyamine Green Ductal Imaging.   Surgeon: [name], M.D., FACS  Anesthesia: General. with endotracheal tube  Findings: Edematous gallbladder, primarily at neck.  Firefly imaging helpful.  Estimated Blood Loss: 10 mL         Drains: None         Specimens: Gallbladder           Complications: none  Procedure Details  The patient was seen again in the Holding Room.  1.25 mg dose of ICG was administered intravenously.   The benefits, complications, treatment options, risks and expected outcomes were again reviewed with the patient. The likelihood of improving the patient's symptoms with return to their baseline status is good.  The patient and/or family concurred with the proposed plan, giving informed consent, again alternatives reviewed.  The patient was taken to Operating Room, identified, and the procedure verified as robotic assisted laparoscopic cholecystectomy.  Prior to the induction of general anesthesia, antibiotic prophylaxis was administered. VTE prophylaxis was in place. General endotracheal anesthesia was then administered and tolerated well. The patient was positioned in the supine position.  After the induction, the abdomen was prepped with Chloraprep and draped in the sterile fashion.  A Time Out was held and the above information confirmed.  After local infiltration of quarter percent Marcaine with epinephrine, stab incision was made left upper quadrant.  Just below the costal margin at Palmer's point, approximately midclavicular line the Veres needle is passed with sensation of the layers to penetrate the abdominal wall and into the peritoneum.  Saline drop test is confirmed peritoneal placement.  Insufflation is initiated with carbon dioxide to pressures  of 15 mmHg.  Right para-umbilical local infiltration with quarter percent Marcaine with epinephrine is utilized.  Made a 12 mm incision on the right periumbilical site, I advanced an optical 11mm port under direct visualization into the peritoneal cavity.  Once the peritoneum was penetrated, insufflation was initiated.  The trocar was then advanced into the abdominal cavity under direct visualization. Pneumoperitoneum was then continued utilizing CO2 at 15 mmHg or less and tolerated well without any adverse changes in the patient's vital signs.  Two 8.5-mm ports were placed in the left lower quadrant and laterally, and one to the right lower quadrant, all under direct vision. All skin incisions  were infiltrated with a local anesthetic agent before making the incision and placing the trocars.  Under direct visualization the 11 mm trocar was replaced with a 12 mm robotic trocar. The patient was positioned  in reverse Trendelenburg, tilted the patient's left side down.  Da Vinci XI robot was then positioned on to the patient's left side, and docked.  The gallbladder was identified, the fundus grasped via the arm 4 Prograsp and retracted cephalad. Adhesions were lysed with scissors and cautery.  The infundibulum was identified grasped and retracted laterally, exposing the peritoneum overlying the triangle of Calot. This was then opened and dissected using cautery & scissors. An extended critical view of the cystic duct and cystic artery was obtained, aided by the ICG via FireFly which improved localization of the ductal anatomy.    The cystic duct was clearly identified and dissected to isolation.   Artery well isolated and clipped, and the cystic duct was triple clipped and divided with scissors, as close to the gallbladder neck as feasible, thus leaving two on the  remaining stump.  The specimen side of the artery is sealed with bipolar and divided with monopolar scissors.   The gallbladder was taken from  the gallbladder fossa in a retrograde fashion with the electrocautery. The gallbladder was removed and placed in an Endocatch bag.  The liver bed is inspected. Hemostasis was confirmed.  The robot was undocked and moved away from the operative field. No irrigation was utilized. The gallbladder and Endocatch sac were then removed through the infraumbilical port site.   Inspection of the right upper quadrant was performed. No bleeding, bile duct injury or leak, or bowel injury was noted. The infra-umbilical port site fascia was closed with interrumpted 0 Vicryl sutures using PMI/cone under direct visualization. Pneumoperitoneum was released and ports removed.  4-0 subcuticular Monocryl was used to close the skin. Dermabond was  applied.  The patient was then extubated and brought to the recovery room in stable condition. Sponge, lap, and needle counts were correct at closure and at the conclusion of the case.               [name], M.D., [facility] 12/26/2022 5:49 PM

## 2022-12-26 NOTE — Anesthesia Postprocedure Evaluation (Signed)
Anesthesia Post Note  Patient: Brianna Phillips  Procedure(s) Performed: XI ROBOTIC ASSISTED LAPAROSCOPIC CHOLECYSTECTOMY (Abdomen) INDOCYANINE GREEN FLUORESCENCE IMAGING (ICG) (Abdomen)  Patient location during evaluation: PACU Anesthesia Type: General Level of consciousness: awake and alert Pain management: pain level controlled Vital Signs Assessment: post-procedure vital signs reviewed and stable Respiratory status: spontaneous breathing, nonlabored ventilation, respiratory function stable and patient connected to nasal cannula oxygen Cardiovascular status: blood pressure returned to baseline and stable Postop Assessment: no apparent nausea or vomiting Anesthetic complications: no  No notable events documented.   Last Vitals:  Vitals:   12/26/22 1830 12/26/22 1845  BP: (!) 139/123 132/85  Pulse: 86 87  Resp: 16 15  Temp:  36.6 C  SpO2: 100% 98%    Last Pain:  Vitals:   12/26/22 1845  TempSrc:   PainSc: 7                  Stephanie Coup

## 2022-12-26 NOTE — ED Provider Notes (Addendum)
[facility] Provider Note    Event Date/Time   First MD Initiated Contact with Patient 12/26/22 [id number]     (approximate)   History   Abdominal Pain   HPI  Brianna Phillips is a 42 y.o. female   Past medical history of gastritis, chronic pain on pain management, here with abdominal discomfort after eating dinner last night.  Waxes and wanes had an episode last month as well that spontaneously resolved.  Was never worked up last month when she had her episode.  No abdominal surgical history.  Feels epigastric/right upper quadrant.  Radiates to the upper back.  Some associated nausea and vomiting.    No GU symptoms or GI bleeding.  No fever or chills.  Not a significant alcohol user.  No recent travel.  No known sick contacts.      She takes no significant Tylenol or NSAIDs for chronic pain.   External Medical Documents Reviewed: 2023 upper endoscopy that showed small hiatal hernia and esophageal stenosis, benign, as well as gastritis      Physical Exam   Triage Vital Signs: ED Triage Vitals  Enc Vitals Group     BP 12/26/22 0543 (!) 129/90     Pulse Rate 12/26/22 0543 (!) 108     Resp 12/26/22 0543 18     Temp 12/26/22 0543 98.1 F (36.7 C)     Temp src --      SpO2 12/26/22 0543 100 %     Weight 12/26/22 0537 140 lb (63.5 kg)     Height 12/26/22 0537 5\' 1"  (1.549 m)     Head Circumference --      Peak Flow --      Pain Score 12/26/22 0539 10     Pain Loc --      Pain Edu? --      Excl. in GC? --     Most recent vital signs: Vitals:   12/26/22 0543  BP: (!) 129/90  Pulse: (!) 108  Resp: 18  Temp: 98.1 F (36.7 C)  SpO2: 100%    General: Awake, no distress.  CV:  Good peripheral perfusion.  Resp:  Normal effort.  Abd:  No distention.  Other:  Mild tachycardia.  Appears uncomfortable.  Mostly right upper quadrant for gastric tenderness but is diffusely tender throughout without rigidity or guarding.   ED Results / Procedures  / Treatments   Labs (all labs ordered are listed, but only abnormal results are displayed) Labs Reviewed  COMPREHENSIVE METABOLIC PANEL - Abnormal; Notable for the following components:      Result Value   Glucose, Bld 124 (*)    AST 428 (*)    ALT 151 (*)    All other components within normal limits  LIPASE, BLOOD - Abnormal; Notable for the following components:   Lipase 92 (*)    All other components within normal limits  URINALYSIS, ROUTINE W REFLEX MICROSCOPIC - Abnormal; Notable for the following components:   Color, Urine YELLOW (*)    APPearance CLEAR (*)    Specific Gravity, Urine 1.045 (*)    All other components within normal limits  CBC WITH DIFFERENTIAL/PLATELET  HCG, QUANTITATIVE, PREGNANCY  HEPATITIS PANEL, ACUTE  POC URINE PREG, ED     I ordered and reviewed the above labs they are notable for LFTs are elevated in the 100s and mildly elevated lipase at 92, white blood cell count is normal  EKG  ED ECG REPORT I,  Pilar Jarvis, the attending physician, personally viewed and interpreted this ECG.   Date: 12/26/2022  EKG Time: 0542  Rate: 99  Rhythm: nsr  Axis: nl  Intervals:none  ST&T Change: no stemi    RADIOLOGY I independently reviewed and interpreted gallbladder ultrasound see no cholelithiasis or wall thickening to suggest cholelithiasis or cholecystitis   PROCEDURES:  Critical Care performed: No  Procedures   MEDICATIONS ORDERED IN ED: Medications  morphine (PF) 4 MG/ML injection 4 mg (4 mg Intravenous Given 12/26/22 0831)  ondansetron (ZOFRAN) injection 4 mg (4 mg Intravenous Given 12/26/22 0831)  sodium chloride 0.9 % bolus 1,000 mL (1,000 mLs Intravenous New Bag/Given 12/26/22 0832)  morphine (PF) 4 MG/ML injection 4 mg (4 mg Intravenous Given 12/26/22 0908)  pantoprazole (PROTONIX) injection 40 mg (40 mg Intravenous Given 12/26/22 0908)  alum & mag hydroxide-simeth (MAALOX/MYLANTA) 200-200-20 MG/5ML suspension 30 mL (30 mLs Oral Given  12/26/22 0908)    And  lidocaine (XYLOCAINE) 2 % viscous mouth solution 15 mL (15 mLs Oral Given 12/26/22 0908)  iohexol (OMNIPAQUE) 300 MG/ML solution 100 mL (100 mLs Intravenous Contrast Given 12/26/22 0941)    External physician / consultants:  I spoke with the Vanga of GI and Lynden Oxford of general surgery regarding care plan for this patient.   IMPRESSION / MDM / ASSESSMENT AND PLAN / ED COURSE  I reviewed the triage vital signs and the nursing notes.                               Patient's presentation is most consistent with acute presentation with potential threat to life or bodily function.  Differential diagnosis includes, but is not limited to, cholecystitis, cholelithiasis, choledocholithiasis, pancreatitis, hepatitis, perforated viscus, gastritis, other intra-abdominal infection like appendicitis, diverticulitis, ACS, dissection   The patient is on the cardiac monitor to evaluate for evidence of arrhythmia and/or significant heart rate changes.  MDM: This is a patient with elevated liver enzymes and mild lipase w right upper quadrant pain but no evidence of gallstones, cholecystitis or dilation on her right upper quadrant ultrasound -initially this seemed consistent with biliary pathologies but this is less likely given a completely normal right upper quadrant ultrasound.    I ordered a CT scan of the abdomen pelvis with IV contrast to further assess, as well as a hepatitis panel.  IV fluids, IV narcotic pain medication, IV antiemetic. --  A normal right upper quadrant ultrasound was followed by CT scan which shows some pericholecystic fluid/wall thickening, MRCP was ordered as further diagnostic.  Patient with some residual pain.  Stable.  I spoke with Dr. Allegra Lai of GI who reviewed symptomatology and labs which would be inconsistent with choledocholithiasis, available for further consultation if MRCP shows intervenable process, Vanga confirms ERCP is available at [facility]  today.  At the same time I consulted with general surgery now with some pericholecystic fluid/wall thickening concerning for cholecystitis.  No fever no white blood cell count so I defer antibiotics at this time.  Admit to hospitalist service        FINAL CLINICAL IMPRESSION(S) / ED DIAGNOSES   Final diagnoses:  Hepatitis  Pain of upper abdomen     Rx / DC Orders   ED Discharge Orders     None        Note:  This document was prepared using Dragon voice recognition software and may include unintentional dictation errors.    Pilar Jarvis,  MD 12/26/22 1101    Pilar Jarvis, MD 12/26/22 [phone]

## 2022-12-26 NOTE — Assessment & Plan Note (Signed)
Worsening RUQ abd pain x 3 weeks with concern for cholecystitis vs. Choledocholithiasis  CT A&P race pericholecystic fluid versus mild gallbladder wall thickening-not noted on prior RUQ u/s  T bili WNL  AST 428/ALT 151  Per EDP case discussed w/ Dr. Claudine Mouton w/ general surgery and Dr. Allegra Lai w/ GI  Pending MRCP to better delineate.  Pain control  Antiemetics  F/u pending MRCP imaging

## 2022-12-26 NOTE — ED Notes (Signed)
Upon arrival back to room 4 from CT pt endorsing facial itching, very minor and tolerable per pt. She states that she had contrast in January and noted localized itching and rash to extremity contrast was injected into. Pt A&O x4, denies SOB, no obvious distress noted. Pt chart updated to reflect allergy.

## 2022-12-26 NOTE — ED Triage Notes (Signed)
Patient ambulatory to triage with steady gait, without difficulty, appears uncomfortable; c/o mid upper abd pain radiating into back since yesterday; denies any accomp symptoms
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
## 2022-12-26 NOTE — Anesthesia Preprocedure Evaluation (Signed)
Anesthesia Evaluation  Patient identified by MRN, date of birth, ID band Patient awake    Reviewed: Allergy & Precautions, NPO status , Patient's Chart, lab work & pertinent test results  Airway Mallampati: III  TM Distance: >3 FB Neck ROM: full    Dental  (+) Chipped, Dental Advidsory Given   Pulmonary neg pulmonary ROS, former smoker   Pulmonary exam normal        Cardiovascular hypertension, On Medications Normal cardiovascular exam     Neuro/Psych  PSYCHIATRIC DISORDERS Anxiety     negative neurological ROS     GI/Hepatic negative GI ROS, Neg liver ROS,GERD  Medicated and Controlled,,  Endo/Other  negative endocrine ROS    Renal/GU      Musculoskeletal   Abdominal   Peds  Hematology negative hematology ROS (+)   Anesthesia Other Findings Past Medical History: No date: Anxiety No date: Cancer [facility])     Comment:  ovarian  No date: Complication of anesthesia No date: Family history of breast cancer No date: Family history of ovarian cancer No date: Gastric ulcer No date: PONV (postoperative nausea and vomiting) No date: TMJ (dislocation of temporomandibular joint)  Past Surgical History: 12/01/2021: COLONOSCOPY; N/A     Comment:  Procedure: COLONOSCOPY;  Surgeon: Midge Minium, MD;                Location: [facility];  Service: Endoscopy;                Laterality: N/A; No date: DILATION AND CURETTAGE OF UTERUS 12/01/2021: ESOPHAGOGASTRODUODENOSCOPY; N/A     Comment:  Procedure: ESOPHAGOGASTRODUODENOSCOPY (EGD);  Surgeon:               Midge Minium, MD;  Location: [facility];                Service: Endoscopy;  Laterality: N/A; No date: FINGER SURGERY No date: FRACTURE SURGERY     Comment:  Left Wrist 01/23/2012: I & D EXTREMITY     Comment:  Procedure: IRRIGATION AND DEBRIDEMENT EXTREMITY;                Surgeon: Sharma Covert, MD;  Location: [facility] OR;  Service:                Orthopedics;  Laterality: Left;  Left Index Finger No date: joint arthorplasty; Left     Comment:  left wrist temporaomandibular  BMI    Body Mass Index: 26.45 kg/m      Reproductive/Obstetrics negative OB ROS                             Anesthesia Physical Anesthesia Plan  ASA: 2  Anesthesia Plan: General ETT   Post-op Pain Management:    Induction: Intravenous  PONV Risk Score and Plan: 4 or greater and Ondansetron, Dexamethasone and Midazolam  Airway Management Planned: Oral ETT and Video Laryngoscope Planned  Additional Equipment:   Intra-op Plan:   Post-operative Plan: Extubation in OR  Informed Consent: I have reviewed the patients History and Physical, chart, labs and discussed the procedure including the risks, benefits and alternatives for the proposed anesthesia with the patient or authorized representative who has indicated his/her understanding and acceptance.     Dental Advisory Given  Plan Discussed with: Anesthesiologist, CRNA and Surgeon  Anesthesia Plan Comments: (Patient consented for risks of anesthesia including but not limited to:  - adverse reactions to medications -  damage to eyes, teeth, lips or other oral mucosa - nerve damage due to positioning  - sore throat or hoarseness - Damage to heart, brain, nerves, lungs, other parts of body or loss of life  Patient voiced understanding.)       Anesthesia Quick Evaluation

## 2022-12-26 NOTE — Assessment & Plan Note (Signed)
PPI ?

## 2022-12-26 NOTE — ED Notes (Signed)
MRI called to speak with patient at this time

## 2022-12-26 NOTE — Consult Note (Signed)
[facility] SURGICAL CONSULTATION NOTE (initial) - cpt: 99244   HISTORY OF PRESENT ILLNESS (HPI):  Brianna Phillips presented to [facility] ED today for evaluation of abdominal pain. Patient reports that 3 weeks ago she had an episode of epigastric abdominal pain which caused her to be doubled over in pain. This sort of crescendoed and then resolved itself after a few minutes. She then had another episode at 0600 this AM and again at 0800. This was accompanied by nausea and emesis in the ED. No fever, chills, cough, SOB, CP, urinary changes, or bowel changes. No previous history of similar prior to these episodes. No previous intra-abdominal surgeries. Work up in the ED revealed of normal WBC at 8.9K, renal function normal with sCr - 0.73, AST was elevated at 428, ALT elevated to 151, bilirubin level normal at 0.9, lipase mildly elevated to 93. She underwent RUQ US which was benign. She then had CT Abdomen/Pelvis which did show possible cholecystitis without stones. MRCP was obtained due to elevated LFT but this was without evidence of choledocholithiasis and again concerning for possible acalculous cholecystitis. She was admitted to the medicine service for work up.   Surgery is consulted by emergency medicine physician Dr. Silas Wong, MD in this context for evaluation and management of possible acalculous cholecystitis.  PAST MEDICAL HISTORY (PMH):  Past Medical History:  Diagnosis Date   Anxiety    Cancer (HCC)    ovarian    Complication of anesthesia    Family history of breast cancer    Family history of ovarian cancer    Gastric ulcer    PONV (postoperative nausea and vomiting)    TMJ (dislocation of temporomandibular joint)      PAST SURGICAL HISTORY (PSH):  Past Surgical History:  Procedure Laterality Date   COLONOSCOPY N/A 12/01/2021   Procedure: COLONOSCOPY;  Surgeon: Wohl, Darren, MD;  Location: [facility];  Service: Endoscopy;  Laterality: N/A;   DILATION AND  CURETTAGE OF UTERUS     ESOPHAGOGASTRODUODENOSCOPY N/A 12/01/2021   Procedure: ESOPHAGOGASTRODUODENOSCOPY (EGD);  Surgeon: Wohl, Darren, MD;  Location: [facility];  Service: Endoscopy;  Laterality: N/A;   FINGER SURGERY     FRACTURE SURGERY     Left Wrist   I & D EXTREMITY  01/23/2012   Procedure: IRRIGATION AND DEBRIDEMENT EXTREMITY;  Surgeon: Fred W Ortmann, MD;  Location: [facility];  Service: Orthopedics;  Laterality: Left;  Left Index Finger   joint arthorplasty Left    left wrist temporaomandibular     MEDICATIONS:  Prior to Admission medications   Medication Sig Start Date End Date Taking? Authorizing Provider  Azelastine-Fluticasone (DYMISTA) 137-50 MCG/ACT SUSP Place 1 spray into the nose 2 (two) times daily. 11/16/22   Andrews, Elisabeth, DO  buPROPion (WELLBUTRIN XL) 150 MG 24 hr tablet Take 1 tablet (150 mg total) by mouth daily. 11/16/22   Andrews, Elisabeth, DO  escitalopram (LEXAPRO) 20 MG tablet Take 1 tablet (20 mg total) by mouth daily. Courtesy refill. OFFICE VISIT NEEDED FOR ADDITIONAL REFILLS 11/16/22   Andrews, Elisabeth, DO  hydrOXYzine (VISTARIL) 25 MG capsule TAKE 1 CAPSULE BY MOUTH EVERY 8 HOURS AS NEEDED 03/20/22   Andrews, Elisabeth, DO  levocetirizine (XYZAL) 5 MG tablet Take 1 tablet (5 mg total) by mouth every evening. 10/06/21   Pender, Julie F, FNP  lisinopril-hydrochlorothiazide (ZESTORETIC) 20-12.5 MG tablet Take 1 tablet by mouth daily. 11/16/22   Andrews, Elisabeth, DO  Multiple Vitamins-Minerals (ONE DAILY CALCIUM/IRON PO) Take by mouth.      [provider]  pantoprazole (PROTONIX) 40 MG tablet Take 1 tablet (40 mg total) by mouth daily. 10/06/21   Pender, Julie F, FNP  Vitamin D, Ergocalciferol, (DRISDOL) 1.25 MG (50000 UNIT) CAPS capsule Take 1 capsule (50,000 Units total) by mouth every 7 (seven) days. 10/09/21   Pender, Julie F, FNP     ALLERGIES:  Allergies  Allergen Reactions   Penicillins Hives   Iodinated Contrast Media Itching   Other  Swelling and Rash    Eggplant allergy     SOCIAL HISTORY:  Social History   Socioeconomic History   Marital status: Divorced    Spouse name: Not on file   Number of children: Not on file   Years of education: Not on file   Highest education level: Not on file  Occupational History   Not on file  Tobacco Use   Smoking status: Former   Smokeless tobacco: Never  Vaping Use   Vaping Use: Never used  Substance and Sexual Activity   Alcohol use: Yes    Comment: Ocassional   Drug use: No   Sexual activity: Yes  Other Topics Concern   Not on file  Social History Narrative   She is only working part time to support her kids emotionally, she lost her 16 yo daughter in 2021 and giving more support to her two living sons   Social Determinants of Health   Financial Resource Strain: Not on file  Food Insecurity: Not on file  Transportation Needs: Not on file  Physical Activity: Not on file  Stress: Not on file  Social Connections: Not on file  Intimate Partner Violence: Not on file     FAMILY HISTORY:  Family History  Problem Relation Age of Onset   Breast cancer Maternal Grandmother        young   Breast cancer Maternal Aunt        young   Ovarian cancer Maternal Aunt       REVIEW OF SYSTEMS:  Review of Systems  Constitutional:  Negative for chills and fever.  HENT:  Negative for congestion and sore throat.   Respiratory:  Negative for cough and shortness of breath.   Cardiovascular:  Negative for chest pain and palpitations.  Gastrointestinal:  Positive for abdominal pain, nausea and vomiting. Negative for constipation and diarrhea.  Genitourinary:  Negative for dysuria and urgency.  All other systems reviewed and are negative.   VITAL SIGNS:  Temp:  [98.1 F (36.7 C)] 98.1 F (36.7 C) (05/22 0543) Pulse Rate:  [108] 108 (05/22 0543) Resp:  [18] 18 (05/22 0543) BP: (129)/(90) 129/90 (05/22 0543) SpO2:  [100 %] 100 % (05/22 0543) Weight:  [63.5 kg] 63.5 kg  (05/22 0537)     Height: 5' 1" (154.9 cm) Weight: 63.5 kg BMI (Calculated): 26.47   INTAKE/OUTPUT:  No intake/output data recorded.  PHYSICAL EXAM:  Physical Exam Vitals and nursing note reviewed. Exam conducted with a chaperone present.  Constitutional:      General: She is not in acute distress.    Appearance: She is well-developed and normal weight. She is not ill-appearing.  HENT:     Head: Normocephalic and atraumatic.  Eyes:     General: No scleral icterus.    Extraocular Movements: Extraocular movements intact.  Cardiovascular:     Rate and Rhythm: Normal rate.     Heart sounds: Normal heart sounds. No murmur heard. Pulmonary:     Effort: Pulmonary effort is normal. No respiratory distress.    Abdominal:     General: Abdomen is flat. There is no distension.     Palpations: Abdomen is soft.     Tenderness: There is abdominal tenderness in the right upper quadrant and epigastric area. There is no guarding or rebound. Positive signs include Murphy's sign.  Genitourinary:    Comments: Deferred Skin:    General: Skin is warm and dry.  Neurological:     General: No focal deficit present.     Mental Status: She is alert and oriented to person, place, and time.  Psychiatric:        Mood and Affect: Mood normal.        Behavior: Behavior normal.      Labs:     Latest Ref Rng & Units 12/26/2022    5:42 AM 10/06/2021    4:12 PM 01/13/2019    2:23 PM  CBC  WBC 4.0 - 10.5 K/uL 8.9  5.7  5.7   Hemoglobin 12.0 - 15.0 g/dL 12.5  11.6  13.7   Hematocrit 36.0 - 46.0 % 37.6  34.6  40.7   Platelets 150 - 400 K/uL 284  257  269       Latest Ref Rng & Units 12/26/2022    5:42 AM 04/20/2022    4:14 PM 10/06/2021    4:12 PM  CMP  Glucose 70 - 99 mg/dL 124  101  93   BUN 6 - 20 mg/dL 11  8  13   Creatinine 0.44 - 1.00 mg/dL 0.73  0.73  0.71   Sodium 135 - 145 mmol/L 137  139  139   Potassium 3.5 - 5.1 mmol/L 3.5  4.3  4.1   Chloride 98 - 111 mmol/L 101  104  104   CO2 22 - 32 mmol/L  27  24  27   Calcium 8.9 - 10.3 mg/dL 9.0  9.2  9.0   Total Protein 6.5 - 8.1 g/dL 7.3  6.7  6.9   Total Bilirubin 0.3 - 1.2 mg/dL 0.9  0.4  0.5   Alkaline Phos 38 - 126 U/L 90     AST 15 - 41 U/L 428  16  17   ALT 0 - 44 U/L 151  19  15      Imaging studies:   RUQ US (12/26/2022) personally reviewed which showed benign appearing gallbladder without stones, and radiologist report reviewed below;  IMPRESSION: 1. No acute findings. 2. Diffusely increased hepatic echogenicity, compatible with hepatic steatosis.   CT Abdomen/Pelvis (12/26/2022) personally reviewed with mild pericholecystic inflammation and fluid, and radiologist report reviewed below:  IMPRESSION: 1. Trace pericholecystic fluid versus mild gallbladder wall thickening, features not evident on ultrasound exam earlier today. No calcified gallstones. Nuclear scintigraphy could be used to assess for cystic duct obstruction as clinically warranted. 2. Otherwise unremarkable exam.   MRCP (12/26/2022) personally reviewed which again shows pericholecystic fluid and inflammation, no stones, no choledocholithiasis, and radiologist report reviewed below:  IMPRESSION: 1. Pericholecystic fluid and gallbladder wall enhancement but no gallstones are identified. Findings could suggest acalculous cholecystitis. 2. Normal caliber and course of the common bile duct. 3. No other acute abdominal findings, mass lesions or adenopathy. 4. Moderate to large hiatal hernia.   Assessment/Plan: (ICD-10's: K81.0) Brianna Phillips with RUQ abdominal pain whose work up appears consistent with acalculous cholecystitis.   - Given work up and continued RUQ pain, we will plan to proceed with robotic assisted laparoscopic cholecystectomy this afternoon with Dr Rodenberg pending OR/Anesthesia   availability  - All risks, benefits, and alternatives to above procedure(s) were discussed with the patient, all of her questions were answered to her expressed  satisfaction, patient expresses she wishes to proceed, and informed consent was obtained.   - NPO + IVF Support - IV Abx (Cipro)  - ICG on call to OR - Monitor abdominal examination   - Pain control prn; antiemetics prn - Mobilize as tolerated   - Further management per primary service; we will follow   All of the above findings and recommendations were discussed with the patient, and all of patient's questions were answered to her expressed satisfaction.  Thank you for the opportunity to participate in this patient's care.   -- Baljit Liebert, PA-C [facility] 12/26/2022, 11:16 AM M-F: 7am - 4pm  

## 2022-12-26 NOTE — Transfer of Care (Signed)
Immediate Anesthesia Transfer of Care Note  Patient: Brianna Phillips  Procedure(s) Performed: XI ROBOTIC ASSISTED LAPAROSCOPIC CHOLECYSTECTOMY (Abdomen) INDOCYANINE GREEN FLUORESCENCE IMAGING (ICG) (Abdomen)  Patient Location: PACU  Anesthesia Type:General  Level of Consciousness: drowsy  Airway & Oxygen Therapy: Patient Spontanous Breathing and Patient connected to face mask oxygen  Post-op Assessment: Report given to RN and Post -op Vital signs reviewed and stable  Post vital signs: Reviewed  Last Vitals:  Vitals Value Taken Time  BP 116/72 12/26/22 1800  Temp 1F   Pulse 100 12/26/22 1802  Resp 17 12/26/22 1802  SpO2 87 % 12/26/22 1802  Vitals shown include unvalidated device data.  Last Pain:  Vitals:   12/26/22 1619  TempSrc: Temporal  PainSc: 5          Complications: No notable events documented.

## 2022-12-26 NOTE — Assessment & Plan Note (Signed)
BP stable Titrate home regimen 

## 2022-12-26 NOTE — H&P (View-Only) (Signed)
[facility] SURGICAL CONSULTATION NOTE (initial) - cpt: 99244   HISTORY OF PRESENT ILLNESS (HPI):  42 y.o. female presented to [facility] ED today for evaluation of abdominal pain. Patient reports that 3 weeks ago she had an episode of epigastric abdominal pain which caused her to be doubled over in pain. This sort of crescendoed and then resolved itself after a few minutes. She then had another episode at 0600 this AM and again at 0800. This was accompanied by nausea and emesis in the ED. No fever, chills, cough, SOB, CP, urinary changes, or bowel changes. No previous history of similar prior to these episodes. No previous intra-abdominal surgeries. Work up in the ED revealed of normal WBC at 8.9K, renal function normal with sCr - 0.73, AST was elevated at 428, ALT elevated to 151, bilirubin level normal at 0.9, lipase mildly elevated to 93. She underwent RUQ [place] which was benign. She then had CT Abdomen/Pelvis which did show possible cholecystitis without stones. MRCP was obtained due to elevated LFT but this was without evidence of choledocholithiasis and again concerning for possible acalculous cholecystitis. She was admitted to the medicine service for work up.   Surgery is consulted by emergency medicine physician Dr. Pilar Jarvis, MD in this context for evaluation and management of possible acalculous cholecystitis.  PAST MEDICAL HISTORY (PMH):  Past Medical History:  Diagnosis Date   Anxiety    Cancer (HCC)    ovarian    Complication of anesthesia    Family history of breast cancer    Family history of ovarian cancer    Gastric ulcer    PONV (postoperative nausea and vomiting)    TMJ (dislocation of temporomandibular joint)      PAST SURGICAL HISTORY (PSH):  Past Surgical History:  Procedure Laterality Date   COLONOSCOPY N/A 12/01/2021   Procedure: COLONOSCOPY;  Surgeon: Midge Minium, MD;  Location: [facility];  Service: Endoscopy;  Laterality: N/A;   DILATION AND  CURETTAGE OF UTERUS     ESOPHAGOGASTRODUODENOSCOPY N/A 12/01/2021   Procedure: ESOPHAGOGASTRODUODENOSCOPY (EGD);  Surgeon: Midge Minium, MD;  Location: [facility];  Service: Endoscopy;  Laterality: N/A;   FINGER SURGERY     FRACTURE SURGERY     Left Wrist   I & D EXTREMITY  01/23/2012   Procedure: IRRIGATION AND DEBRIDEMENT EXTREMITY;  Surgeon: Sharma Covert, MD;  Location: [facility];  Service: Orthopedics;  Laterality: Left;  Left Index Finger   joint arthorplasty Left    left wrist temporaomandibular     MEDICATIONS:  Prior to Admission medications   Medication Sig Start Date End Date Taking? Authorizing Provider  Azelastine-Fluticasone (DYMISTA) 137-50 MCG/ACT SUSP Place 1 spray into the nose 2 (two) times daily. 11/16/22   Margarita Mail, DO  buPROPion (WELLBUTRIN XL) 150 MG 24 hr tablet Take 1 tablet (150 mg total) by mouth daily. 11/16/22   Margarita Mail, DO  escitalopram (LEXAPRO) 20 MG tablet Take 1 tablet (20 mg total) by mouth daily. Courtesy refill. OFFICE VISIT NEEDED FOR ADDITIONAL REFILLS 11/16/22   Margarita Mail, DO  hydrOXYzine (VISTARIL) 25 MG capsule TAKE 1 CAPSULE BY MOUTH EVERY 8 HOURS AS NEEDED 03/20/22   Margarita Mail, DO  levocetirizine (XYZAL) 5 MG tablet Take 1 tablet (5 mg total) by mouth every evening. 10/06/21   Berniece Salines, FNP  lisinopril-hydrochlorothiazide (ZESTORETIC) 20-12.5 MG tablet Take 1 tablet by mouth daily. 11/16/22   Margarita Mail, DO  Multiple Vitamins-Minerals (ONE DAILY CALCIUM/IRON PO) Take by mouth.  [provider]  pantoprazole (PROTONIX) 40 MG tablet Take 1 tablet (40 mg total) by mouth daily. 10/06/21   Berniece Salines, FNP  Vitamin D, Ergocalciferol, (DRISDOL) 1.25 MG (50000 UNIT) CAPS capsule Take 1 capsule (50,000 Units total) by mouth every 7 (seven) days. 10/09/21   Berniece Salines, FNP     ALLERGIES:  Allergies  Allergen Reactions   Penicillins Hives   Iodinated Contrast Media Itching   Other  Swelling and Rash    Eggplant allergy     SOCIAL HISTORY:  Social History   Socioeconomic History   Marital status: Divorced    Spouse name: Not on file   Number of children: Not on file   Years of education: Not on file   Highest education level: Not on file  Occupational History   Not on file  Tobacco Use   Smoking status: Former   Smokeless tobacco: Never  Vaping Use   Vaping Use: Never used  Substance and Sexual Activity   Alcohol use: Yes    Comment: Ocassional   Drug use: No   Sexual activity: Yes  Other Topics Concern   Not on file  Social History Narrative   She is only working part time to support her kids emotionally, she lost her 83 yo daughter in 2021 and giving more support to her two living sons   Social Determinants of Corporate investment banker Strain: Not on file  Food Insecurity: Not on file  Transportation Needs: Not on file  Physical Activity: Not on file  Stress: Not on file  Social Connections: Not on file  Intimate Partner Violence: Not on file     FAMILY HISTORY:  Family History  Problem Relation Age of Onset   Breast cancer Maternal Grandmother        young   Breast cancer Maternal Aunt        young   Ovarian cancer Maternal Aunt       REVIEW OF SYSTEMS:  Review of Systems  Constitutional:  Negative for chills and fever.  HENT:  Negative for congestion and sore throat.   Respiratory:  Negative for cough and shortness of breath.   Cardiovascular:  Negative for chest pain and palpitations.  Gastrointestinal:  Positive for abdominal pain, nausea and vomiting. Negative for constipation and diarrhea.  Genitourinary:  Negative for dysuria and urgency.  All other systems reviewed and are negative.   VITAL SIGNS:  Temp:  [98.1 F (36.7 C)] 98.1 F (36.7 C) (05/22 0543) Pulse Rate:  [108] 108 (05/22 0543) Resp:  [18] 18 (05/22 0543) BP: (129)/(90) 129/90 (05/22 0543) SpO2:  [100 %] 100 % (05/22 0543) Weight:  [63.5 kg] 63.5 kg  (05/22 0537)     Height: 5\' 1"  (154.9 cm) Weight: 63.5 kg BMI (Calculated): 26.47   INTAKE/OUTPUT:  No intake/output data recorded.  PHYSICAL EXAM:  Physical Exam Vitals and nursing note reviewed. Exam conducted with a chaperone present.  Constitutional:      General: She is not in acute distress.    Appearance: She is well-developed and normal weight. She is not ill-appearing.  HENT:     Head: Normocephalic and atraumatic.  Eyes:     General: No scleral icterus.    Extraocular Movements: Extraocular movements intact.  Cardiovascular:     Rate and Rhythm: Normal rate.     Heart sounds: Normal heart sounds. No murmur heard. Pulmonary:     Effort: Pulmonary effort is normal. No respiratory distress.  Abdominal:     General: Abdomen is flat. There is no distension.     Palpations: Abdomen is soft.     Tenderness: There is abdominal tenderness in the right upper quadrant and epigastric area. There is no guarding or rebound. Positive signs include Murphy's sign.  Genitourinary:    Comments: Deferred Skin:    General: Skin is warm and dry.  Neurological:     General: No focal deficit present.     Mental Status: She is alert and oriented to person, place, and time.  Psychiatric:        Mood and Affect: Mood normal.        Behavior: Behavior normal.      Labs:     Latest Ref Rng & Units 12/26/2022    5:42 AM 10/06/2021    4:12 PM 01/13/2019    2:23 PM  CBC  WBC 4.0 - 10.5 K/uL 8.9  5.7  5.7   Hemoglobin 12.0 - 15.0 g/dL 16.1  09.6  04.5   Hematocrit 36.0 - 46.0 % 37.6  34.6  40.7   Platelets 150 - 400 K/uL 284  257  269       Latest Ref Rng & Units 12/26/2022    5:42 AM 04/20/2022    4:14 PM 10/06/2021    4:12 PM  CMP  Glucose 70 - 99 mg/dL 409  811  93   BUN 6 - 20 mg/dL 11  8  13    Creatinine 0.44 - 1.00 mg/dL 9.14  7.82  9.56   Sodium 135 - 145 mmol/L 137  139  139   Potassium 3.5 - 5.1 mmol/L 3.5  4.3  4.1   Chloride 98 - 111 mmol/L 101  104  104   CO2 22 - 32 mmol/L  27  24  27    Calcium 8.9 - 10.3 mg/dL 9.0  9.2  9.0   Total Protein 6.5 - 8.1 g/dL 7.3  6.7  6.9   Total Bilirubin 0.3 - 1.2 mg/dL 0.9  0.4  0.5   Alkaline Phos 38 - 126 U/L 90     AST 15 - 41 U/L 428  16  17   ALT 0 - 44 U/L 151  19  15      Imaging studies:   RUQ [place] (12/26/2022) personally reviewed which showed benign appearing gallbladder without stones, and radiologist report reviewed below;  IMPRESSION: 1. No acute findings. 2. Diffusely increased hepatic echogenicity, compatible with hepatic steatosis.   CT Abdomen/Pelvis (12/26/2022) personally reviewed with mild pericholecystic inflammation and fluid, and radiologist report reviewed below:  IMPRESSION: 1. Trace pericholecystic fluid versus mild gallbladder wall thickening, features not evident on ultrasound exam earlier today. No calcified gallstones. Nuclear scintigraphy could be used to assess for cystic duct obstruction as clinically warranted. 2. Otherwise unremarkable exam.   MRCP (12/26/2022) personally reviewed which again shows pericholecystic fluid and inflammation, no stones, no choledocholithiasis, and radiologist report reviewed below:  IMPRESSION: 1. Pericholecystic fluid and gallbladder wall enhancement but no gallstones are identified. Findings could suggest acalculous cholecystitis. 2. Normal caliber and course of the common bile duct. 3. No other acute abdominal findings, mass lesions or adenopathy. 4. Moderate to large hiatal hernia.   Assessment/Plan: (ICD-10's: K81.0) 42 y.o. female with RUQ abdominal pain whose work up appears consistent with acalculous cholecystitis.   - Given work up and continued RUQ pain, we will plan to proceed with robotic assisted laparoscopic cholecystectomy this afternoon with Dr Claudine Mouton pending Lyna Poser  availability  - All risks, benefits, and alternatives to above procedure(s) were discussed with the patient, all of her questions were answered to her expressed  satisfaction, patient expresses she wishes to proceed, and informed consent was obtained.   - NPO + IVF Support - IV Abx (Cipro)  - ICG on call to OR - Monitor abdominal examination   - Pain control prn; antiemetics prn - Mobilize as tolerated   - Further management per primary service; we will follow   All of the above findings and recommendations were discussed with the patient, and all of patient's questions were answered to her expressed satisfaction.  Thank you for the opportunity to participate in this patient's care.   -- Lynden Oxford, PA-C [facility] 12/26/2022, 11:16 AM M-F: 7am - 4pm

## 2022-12-26 NOTE — ED Notes (Signed)
Pharm tech at bedside 

## 2022-12-26 NOTE — Anesthesia Procedure Notes (Signed)
Procedure Name: Intubation Date/Time: 12/26/2022 4:54 PM  Performed by: Merlene Pulling, CRNAPre-anesthesia Checklist: Patient identified, Patient being monitored, Timeout performed, Emergency Drugs available and Suction available Patient Re-evaluated:Patient Re-evaluated prior to induction Oxygen Delivery Method: Circle system utilized Preoxygenation: Pre-oxygenation with 100% oxygen Induction Type: IV induction Ventilation: Mask ventilation without difficulty Laryngoscope Size: 3 and McGraph Grade View: Grade II Tube type: Oral Tube size: 6.5 mm Number of attempts: 1 Airway Equipment and Method: Stylet Placement Confirmation: ETT inserted through vocal cords under direct vision, positive ETCO2 and breath sounds checked- equal and bilateral Secured at: 21 cm Tube secured with: Tape Dental Injury: Teeth and Oropharynx as per pre-operative assessment

## 2022-12-26 NOTE — ED Notes (Signed)
Pt ambulatory to bathroom to change into gown and socks. Consent made, not signed by pt at this time as she states she would like more explanation of the surgery. Transport to bedside to take pt to surgery at this time. Cipro given to transport as it was pulled from pyxis by this RN, so that they can admin in OR.

## 2022-12-27 LAB — COMPREHENSIVE METABOLIC PANEL
ALT: 206 U/L — ABNORMAL HIGH (ref 0–44)
AST: 180 U/L — ABNORMAL HIGH (ref 15–41)
Albumin: 3.2 g/dL — ABNORMAL LOW (ref 3.5–5.0)
Alkaline Phosphatase: 77 U/L (ref 38–126)
Anion gap: 7 (ref 5–15)
BUN: 6 mg/dL (ref 6–20)
CO2: 24 mmol/L (ref 22–32)
Calcium: 8.1 mg/dL — ABNORMAL LOW (ref 8.9–10.3)
Chloride: 105 mmol/L (ref 98–111)
Creatinine, Ser: 0.66 mg/dL (ref 0.44–1.00)
GFR, Estimated: >60 mL/min (ref >60)
Glucose, Bld: 114 mg/dL — ABNORMAL HIGH (ref 70–99)
Potassium: 4 mmol/L (ref 3.5–5.1)
Sodium: 136 mmol/L (ref 135–145)
Total Bilirubin: 0.7 mg/dL (ref 0.3–1.2)
Total Protein: 6.1 g/dL — ABNORMAL LOW (ref 6.5–8.1)

## 2022-12-27 LAB — CBC
HCT: 31.5 % — ABNORMAL LOW (ref 36.0–46.0)
Hemoglobin: 10.4 g/dL — ABNORMAL LOW (ref 12.0–15.0)
MCH: 31.7 pg (ref 26.0–34.0)
MCHC: 33 g/dL (ref 30.0–36.0)
MCV: 96 fL (ref 80.0–100.0)
Platelets: 219 10*3/uL (ref 150–400)
RBC: 3.28 MIL/uL — ABNORMAL LOW (ref 3.87–5.11)
RDW: 13.6 % (ref 11.5–15.5)
WBC: 8.7 10*3/uL (ref 4.0–10.5)
nRBC: 0 % (ref 0.0–0.2)

## 2022-12-27 LAB — HIV ANTIBODY (ROUTINE TESTING W REFLEX): HIV Screen 4th Generation wRfx: NONREACTIVE

## 2022-12-27 MED ORDER — OXYCODONE-ACETAMINOPHEN 5-325 MG PO TABS: 1.0000 | ORAL_TABLET | ORAL | 0 refills | Status: AC | PRN

## 2022-12-27 MED ORDER — ESCITALOPRAM OXALATE 10 MG PO TABS: 20.0000 mg | ORAL_TABLET | Freq: Every day | ORAL | Status: DC

## 2022-12-27 MED ORDER — SIMETHICONE 80 MG PO CHEW
80.0000 mg | CHEWABLE_TABLET | Freq: Four times a day (QID) | ORAL | Status: DC | PRN
  Administered 2022-12-27: 80 mg via ORAL
  Filled 2022-12-27: qty 1

## 2022-12-27 MED ORDER — BUPROPION HCL ER (XL) 150 MG PO TB24: 150.0000 mg | ORAL_TABLET | Freq: Every day | ORAL | Status: DC

## 2022-12-27 MED ORDER — ONDANSETRON HCL 4 MG PO TABS: 4.0000 mg | ORAL_TABLET | Freq: Four times a day (QID) | ORAL | 0 refills | Status: AC | PRN

## 2022-12-27 MED ORDER — CIPROFLOXACIN HCL 500 MG PO TABS: 500.0000 mg | ORAL_TABLET | Freq: Two times a day (BID) | ORAL | 0 refills | Status: AC

## 2022-12-27 NOTE — Discharge Instructions (Signed)
In addition to included general post-operative instructions,  Diet: Resume home diet. Recommend avoiding or limiting fatty/greasy foods over the next few days/week. If you do eat these, you may (or may not) notice diarrhea. This is expected while your body adjusts to not having a gallbladder, and it typically resolves with time.    Activity: No heavy lifting >20 pounds (children, pets, laundry, garbage) or strenuous activity for 4 weeks, but light activity and walking are encouraged. Do not drive or drink alcohol if taking narcotic pain medications or having pain that might distract from driving.  Wound care: 2 days after surgery (05/24), you may shower/get incision wet with soapy water and pat dry (do not rub incisions), but no baths or submerging incision underwater until follow-up.   Medications: Resume all home medications. For mild to moderate pain: acetaminophen (Tylenol) or ibuprofen/naproxen (if no kidney disease). Combining Tylenol with alcohol can substantially increase your risk of causing liver disease. Narcotic pain medications, if prescribed, can be used for severe pain, though may cause nausea, constipation, and drowsiness. Do not combine Tylenol and Percocet (or similar) within a 6 hour period as Percocet (and similar) contain(s) Tylenol. If you do not need the narcotic pain medication, you do not need to fill the prescription.  Call office ([phone] / [phone]) at any time if any questions, worsening pain, fevers/chills, bleeding, drainage from incision site, or other concerns.  
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
## 2022-12-28 ENCOUNTER — Telehealth: Payer: Self-pay

## 2022-12-28 NOTE — Transitions of Care (Post Inpatient/ED Visit) (Signed)
12/28/2022  Name: Brianna Phillips MRN: [id number] DOB: [birth year]  Today's TOC FU Call Status: Today's TOC FU Call Status:: Successful TOC FU Call Competed TOC FU Call Complete Date: 12/28/22  Transition Care Management Follow-up Telephone Call Date of Discharge: 12/27/22 Discharge Facility: [facility]) Type of Discharge: Inpatient Admission Primary Inpatient Discharge Diagnosis:: liver disease How have you been since you were released from the hospital?: Better Any questions or concerns?: No  Items Reviewed: Did you receive and understand the discharge instructions provided?: Yes Medications obtained,verified, and reconciled?: Yes (Medications Reviewed) Any new allergies since your discharge?: No Dietary orders reviewed?: Yes Do you have support at home?: Yes People in Home: significant other  Medications Reviewed Today: Medications Reviewed Today     Reviewed by Karena Addison, LPN (Licensed Practical Nurse) on 12/28/22 at [phone]  Med List Status: <None>   Medication Order Taking? Sig Documenting Provider Last Dose Status Informant  Azelastine-Fluticasone (DYMISTA) 137-50 MCG/ACT SUSP [id number] Yes Place 1 spray into the nose 2 (two) times daily. Margarita Mail, DO Taking Active Self  buPROPion (WELLBUTRIN XL) 150 MG 24 hr tablet [id number] Yes Take 1 tablet (150 mg total) by mouth daily. Margarita Mail, DO Taking Active Self  ciprofloxacin (CIPRO) 500 MG tablet [id number] Yes Take 1 tablet (500 mg total) by mouth 2 (two) times daily for 7 days. Donovan Kail, PA-C Taking Active   Collagen-Vitamin C-Biotin (COLLAGEN 1500/C PO) [id number] Yes Take 1 Dose by mouth daily. Liquid collagen once a day [provider] Taking Active Self  escitalopram (LEXAPRO) 20 MG tablet [id number] Yes Take 1 tablet (20 mg total) by mouth daily. Courtesy refill. OFFICE VISIT NEEDED FOR ADDITIONAL REFILLS Margarita Mail, DO Taking Active Self   hydrOXYzine (VISTARIL) 25 MG capsule [id number] Yes TAKE 1 CAPSULE BY MOUTH EVERY 8 HOURS AS NEEDED Margarita Mail, DO Taking Active Self  levocetirizine (XYZAL) 5 MG tablet [id number] Yes Take 1 tablet (5 mg total) by mouth every evening. Berniece Salines, FNP Taking Active Self  lisinopril-hydrochlorothiazide (ZESTORETIC) 20-12.5 MG tablet [id number] Yes Take 1 tablet by mouth daily. Margarita Mail, DO Taking Active Self  Multiple Vitamins-Minerals (ONE DAILY CALCIUM/IRON PO) [id number] Yes Take by mouth. [provider] Taking Active Self  ondansetron (ZOFRAN) 4 MG tablet [id number] Yes Take 1 tablet (4 mg total) by mouth every 6 (six) hours as needed for nausea. Donovan Kail, PA-C Taking Active   oxyCODONE-acetaminophen (PERCOCET/ROXICET) 5-325 MG tablet [id number] Yes Take 1-2 tablets by mouth every 4 (four) hours as needed for moderate pain. Donovan Kail, PA-C Taking Active   pantoprazole (PROTONIX) 40 MG tablet [id number] Yes Take 1 tablet (40 mg total) by mouth daily. Berniece Salines, FNP Taking Active Self  Vitamin D, Ergocalciferol, (DRISDOL) 1.25 MG (50000 UNIT) CAPS capsule [id number] Yes Take 1 capsule (50,000 Units total) by mouth every 7 (seven) days. Berniece Salines, FNP Taking Active Self           Med Note [facility], TIFFANY A   Wed Dec 26, 2022 11:29 AM) On either Sunday or Monday            Home Care and Equipment/Supplies: Were Home Health Services Ordered?: NA Any new equipment or medical supplies ordered?: NA  Functional Questionnaire: Do you need assistance with bathing/showering or dressing?: No Do you need assistance with meal preparation?: No Do you need assistance with eating?: No Do you have difficulty maintaining continence: No Do you need assistance with getting out  of bed/getting out of a chair/moving?: No Do you have difficulty managing or taking your medications?: No  Follow up appointments reviewed: PCP Follow-up appointment  confirmed?: NA Specialist Hospital Follow-up appointment confirmed?: No Reason Specialist Follow-Up Not Confirmed: Patient has Specialist Provider Number and will Call for Appointment Do you need transportation to your follow-up appointment?: No Do you understand care options if your condition(s) worsen?: Yes-patient verbalized understanding    SIGNATURE Karena Addison, LPN [facility] Nurse Health Advisor Direct Dial [phone]

## 2023-01-01 ENCOUNTER — Encounter: Payer: Self-pay | Admitting: Surgery

## 2023-01-02 LAB — SURGICAL PATHOLOGY

## 2023-01-12 ENCOUNTER — Other Ambulatory Visit: Payer: Self-pay | Admitting: Internal Medicine

## 2023-01-12 DIAGNOSIS — F419 Anxiety disorder, unspecified: Secondary | ICD-10-CM

## 2023-01-14 NOTE — Telephone Encounter (Signed)
Requested Prescriptions  Pending Prescriptions Disp Refills   buPROPion (WELLBUTRIN XL) 150 MG 24 hr tablet [Pharmacy Med Name: buPROPion HCL XL 150 MG TABLET] 30 tablet 1    Sig: TAKE 1 TABLET BY MOUTH DAILY     Psychiatry: Antidepressants - bupropion Failed - 01/12/2023  6:52 AM      Failed - AST in normal range and within 360 days    AST  Date Value Ref Range Status  12/27/2022 180 (H) 15 - 41 U/L Final         Failed - ALT in normal range and within 360 days    ALT  Date Value Ref Range Status  12/27/2022 206 (H) 0 - 44 U/L Final         Passed - Cr in normal range and within 360 days    Creat  Date Value Ref Range Status  04/20/2022 0.73 0.50 - 0.99 mg/dL Final   Creatinine, Ser  Date Value Ref Range Status  12/27/2022 0.66 0.44 - 1.00 mg/dL Final         Passed - Last BP in normal range    BP Readings from Last 1 Encounters:  12/27/22 131/85         Passed - Valid encounter within last 6 months    Recent Outpatient Visits           1 month ago Hypertension, unspecified type   [facility] Margarita Mail, DO   8 months ago Hypertension, unspecified type   [facility] Berniece Salines, FNP   1 year ago Hypertension, unspecified type   [facility] Margarita Mail, DO   1 year ago History of stomach ulcers   [facility] Margarita Mail, DO   1 year ago Weight gain   [facility] Berniece Salines, FNP       Future Appointments             In 3 days Margarita Mail, DO [facility], [facility]

## 2023-01-17 ENCOUNTER — Ambulatory Visit: Payer: Self-pay | Admitting: Internal Medicine

## 2023-01-22 ENCOUNTER — Encounter: Payer: Self-pay | Admitting: Physician Assistant

## 2023-01-25 ENCOUNTER — Ambulatory Visit: Payer: Self-pay | Admitting: Family Medicine

## 2023-01-25 NOTE — Progress Notes (Deleted)
Name: DEUNDRA FURBER   MRN: [id number]    DOB: [birth year]   Date:01/25/2023       Progress Note  No chief complaint on file.    Subjective:   Brianna Phillips is a 42 y.o. female, presents to clinic for 2 month f/up per her PCP  HTN managed on lisinopril hydrochlorothiazide- dose adjusted as pt was about to begin pain meds  BP Readings from Last 3 Encounters:  12/27/22 131/85  12/03/22 121/75  11/16/22 114/70   Anxiety on lexapro 20 mg, wellbutrin XL 150 mg added 8 weeks ago    11/16/2022    2:34 PM 04/20/2022    3:51 PM 12/20/2021   11:41 AM  Depression screen PHQ 2/9  Decreased Interest 2 0 0  Down, Depressed, Hopeless 2 0 0  PHQ - 2 Score 4 0 0  Altered sleeping 2 0 0  Tired, decreased energy 2 0 0  Change in appetite 2 0 0  Feeling bad or failure about yourself  0 0 0  Trouble concentrating 0 0 0  Moving slowly or fidgety/restless 0 0 0  Suicidal thoughts 0 0 0  PHQ-9 Score 10 0 0  Difficult doing work/chores Somewhat difficult Not difficult at all Not difficult at all      11/16/2022    2:37 PM 04/20/2022    3:52 PM 10/06/2021    3:48 PM 12/07/2020   10:18 AM  GAD 7 : Generalized Anxiety Score  Nervous, Anxious, on Edge 1 0 0 0  Control/stop worrying 2 0 0 0  Worry too much - different things 1 0 0 0  Trouble relaxing 1 0 0 0  Restless 0 0 0 0  Easily annoyed or irritable 0 0 0 0  Afraid - awful might happen 0 0 0 0  Total GAD 7 Score 5 0 0 0  Anxiety Difficulty Somewhat difficult Not difficult at all Not difficult at all    Pt has been to the ED 2x since last OV, admitted in May, did not do HFU - had cholecystectomy - AST/ALT elevated, due to recheck of labs       ***   Current Outpatient Medications:    Azelastine-Fluticasone (DYMISTA) 137-50 MCG/ACT SUSP, Place 1 spray into the nose 2 (two) times daily., Disp: 23 g, Rfl: 3   buPROPion (WELLBUTRIN XL) 150 MG 24 hr tablet, TAKE 1 TABLET BY MOUTH DAILY, Disp: 90 tablet, Rfl: 0   Collagen-Vitamin  C-Biotin (COLLAGEN 1500/C PO), Take 1 Dose by mouth daily. Liquid collagen once a day, Disp: , Rfl:    escitalopram (LEXAPRO) 20 MG tablet, Take 1 tablet (20 mg total) by mouth daily. Courtesy refill. OFFICE VISIT NEEDED FOR ADDITIONAL REFILLS, Disp: 90 tablet, Rfl: 1   hydrOXYzine (VISTARIL) 25 MG capsule, TAKE 1 CAPSULE BY MOUTH EVERY 8 HOURS AS NEEDED, Disp: 270 capsule, Rfl: 0   levocetirizine (XYZAL) 5 MG tablet, Take 1 tablet (5 mg total) by mouth every evening., Disp: 30 tablet, Rfl: 3   lisinopril-hydrochlorothiazide (ZESTORETIC) 20-12.5 MG tablet, Take 1 tablet by mouth daily., Disp: 90 tablet, Rfl: 1   Multiple Vitamins-Minerals (ONE DAILY CALCIUM/IRON PO), Take by mouth., Disp: , Rfl:    ondansetron (ZOFRAN) 4 MG tablet, Take 1 tablet (4 mg total) by mouth every 6 (six) hours as needed for nausea., Disp: 20 tablet, Rfl: 0   oxyCODONE-acetaminophen (PERCOCET/ROXICET) 5-325 MG tablet, Take 1-2 tablets by mouth every 4 (four) hours as needed for moderate pain.,  Disp: 30 tablet, Rfl: 0   pantoprazole (PROTONIX) 40 MG tablet, Take 1 tablet (40 mg total) by mouth daily., Disp: 90 tablet, Rfl: 1   Vitamin D, Ergocalciferol, (DRISDOL) 1.25 MG (50000 UNIT) CAPS capsule, Take 1 capsule (50,000 Units total) by mouth every 7 (seven) days., Disp: 8 capsule, Rfl: 0  Patient Active Problem List   Diagnosis Date Noted   RUQ abdominal pain 12/26/2022   Transaminitis 12/26/2022   Acalculous cholecystitis 12/26/2022   Hypertension 04/20/2022   Family history of colonic polyps    Gastroesophageal reflux disease    Stricture and stenosis of esophagus    Chronic rhinitis 01/14/2019   Dysmenorrhea 01/14/2019   Menorrhagia with regular cycle 01/14/2019   Fatigue 01/14/2019   Stomach ulcer 01/13/2019   Family history of breast cancer 01/13/2019    Past Surgical History:  Procedure Laterality Date   COLONOSCOPY N/A 12/01/2021   Procedure: COLONOSCOPY;  Surgeon: Midge Minium, MD;  Location: [facility];  Service: Endoscopy;  Laterality: N/A;   DILATION AND CURETTAGE OF UTERUS     ESOPHAGOGASTRODUODENOSCOPY N/A 12/01/2021   Procedure: ESOPHAGOGASTRODUODENOSCOPY (EGD);  Surgeon: Midge Minium, MD;  Location: [facility];  Service: Endoscopy;  Laterality: N/A;   FINGER SURGERY     FRACTURE SURGERY     Left Wrist   I & D EXTREMITY  01/23/2012   Procedure: IRRIGATION AND DEBRIDEMENT EXTREMITY;  Surgeon: Sharma Covert, MD;  Location: [facility] OR;  Service: Orthopedics;  Laterality: Left;  Left Index Finger   joint arthorplasty Left    left wrist temporaomandibular    Family History  Problem Relation Age of Onset   Breast cancer Maternal Grandmother        young   Breast cancer Maternal Aunt        young   Ovarian cancer Maternal Aunt     Social History   Tobacco Use   Smoking status: Former   Smokeless tobacco: Never  Building services engineer Use: Never used  Substance Use Topics   Alcohol use: Yes    Comment: Ocassional   Drug use: No     Allergies  Allergen Reactions   Penicillins Hives   Benadryl [Diphenhydramine] Palpitations   Iodinated Contrast Media Itching   Other Swelling and Rash    Eggplant allergy    Health Maintenance  Topic Date Due   COVID-19 Vaccine (1) Never done   PAP-Cervical Cytology Screening  01/12/2022   PAP SMEAR-Modifier  01/12/2022   INFLUENZA VACCINE  03/07/2023   DTaP/Tdap/Td (2 - Td or Tdap) 07/07/2023   Colonoscopy  12/02/2026   Hepatitis C Screening  Completed   HIV Screening  Completed   HPV VACCINES  Aged Out    Chart Review Today: ***  Review of Systems   Objective:   There were no vitals filed for this visit.  There is no height or weight on file to calculate BMI.  Physical Exam      Assessment & Plan:   Problem List Items Addressed This Visit   None    No follow-ups on file.   Danelle Berry, PA-C 01/25/23 1:08 PM

## 2023-01-31 ENCOUNTER — Encounter: Payer: Self-pay | Admitting: Physician Assistant

## 2023-02-14 ENCOUNTER — Ambulatory Visit (INDEPENDENT_AMBULATORY_CARE_PROVIDER_SITE_OTHER): Payer: Self-pay | Admitting: Surgery

## 2023-02-14 VITALS — BP 134/79 | HR 96 | Temp 98.6°F | Ht 61.0 in | Wt 147.4 lb

## 2023-02-14 DIAGNOSIS — R1011 Right upper quadrant pain: Secondary | ICD-10-CM

## 2023-02-14 DIAGNOSIS — K819 Cholecystitis, unspecified: Secondary | ICD-10-CM

## 2023-02-14 DIAGNOSIS — Z09 Encounter for follow-up examination after completed treatment for conditions other than malignant neoplasm: Secondary | ICD-10-CM

## 2023-02-14 DIAGNOSIS — Z9049 Acquired absence of other specified parts of digestive tract: Secondary | ICD-10-CM

## 2023-02-14 NOTE — Patient Instructions (Addendum)
Your CT is scheduled for 02/20/23 1 pm (arrive by 12:45 pm) at [facility] [address]. Nothing to eat or drink 4 hours prior.   If you have any concerns or questions, please feel free to call our office.   Minimally Invasive Cholecystectomy, Care After What can I expect after the procedure? After the procedure, it is common to: Have pain at the areas of surgery. You will be given medicines for pain. Vomit or feel like you may vomit. Feel fullness in the belly (bloating) or have pain in the shoulder. This comes from the gas that was used during the surgery. Follow these instructions at home: Medicines Take over-the-counter and prescription medicines only as told by your doctor. If you were prescribed an antibiotic medicine, take it as told by your doctor. Do not stop taking it even if you start to feel better. If told, take steps to prevent problems with pooping (constipation). You may need to: Drink enough fluid to keep your pee (urine) pale yellow. Take medicines. You will be told what medicines to take. Eat foods that are high in fiber. These include beans, whole grains, and fresh fruits and vegetables. Limit foods that are high in fat and sugar. These include fried or sweet foods. Ask your doctor if you should avoid driving or using machines while you are taking your medicine. Incision care  Follow instructions from your doctor about how to take care of your cuts from surgery (incisions). Make sure you: Wash your hands with soap and water for at least 20 seconds before and after you change your bandage (dressing). If you cannot use soap and water, use hand sanitizer. Change your bandage. Leave stitches (sutures) or skin glue in place for at least 2 weeks. Leave tape strips alone unless you are told to take them off. You may trim the edges of the tape strips if they curl up. Do not take baths, swim, or use a hot tub. Ask your doctor about taking showers or sponge baths. Check  your incision area every day for signs of infection. Check for: More redness, swelling, or pain. Fluid or blood. Warmth. Pus or a bad smell. Activity Rest as told by your doctor. Do not do activities that require a lot of effort. Get up to take short walks every 1 to 2 hours. Ask for help if you feel weak or unsteady. Do not lift anything that is heavier than 10 lb (4.5 kg), or the limit that you are told. Do not play contact sports until your doctor says it is okay. Do not return to work or school until your doctor says it is okay. Return to your normal activities when your doctor says that it is safe. General instructions If you were given a sedative during your procedure, do not drive or use machines until your doctor says that it is safe. A sedative is a medicine that helps you relax. Keep all follow-up visits. Contact a doctor if: You get a rash. You have more redness, swelling, or pain around your incisions. You have fluid or blood coming from your incisions. Your incisions feel warm to the touch. You have pus or a bad smell coming from your incisions. You have a fever. One or more of your incisions breaks open. Get help right away if: You have trouble breathing. You have chest pain. You have pain that is getting worse in your shoulders. You faint or feel dizzy when you stand. You have very bad pain in your belly (  abdomen). You feel like you may vomit or you vomit, and this lasts for more than one day. You have leg pain. These symptoms may be an emergency. Get help right away. Call 911. Do not wait to see if the symptoms will go away. Do not drive yourself to the hospital. Summary After your surgery, it is common to have pain at the areas of surgery. You may also vomit or feel fullness in the belly. Follow your doctor's instructions about medicine, activity restrictions, and caring for your surgery areas. Do not do activities that require a lot of effort. Contact a doctor  if you have a fever or other signs of infection, such as more redness, swelling, or pain around your incisions. Get help right away if you have chest pain, increasing pain in the shoulders, or trouble breathing. This information is not intended to replace advice given to you by your health care provider. Make sure you discuss any questions you have with your health care provider. Document Revised: 01/24/2021 Document Reviewed: 01/24/2021 Elsevier Patient Education  2024 [employer].

## 2023-02-15 ENCOUNTER — Encounter: Payer: Self-pay | Admitting: Surgery

## 2023-02-15 DIAGNOSIS — Z9049 Acquired absence of other specified parts of digestive tract: Secondary | ICD-10-CM | POA: Insufficient documentation
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
## 2023-02-20 ENCOUNTER — Ambulatory Visit: Admission: RE | Admit: 2023-02-20 | Payer: Self-pay | Source: Ambulatory Visit
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
## 2023-04-05 ENCOUNTER — Ambulatory Visit: Payer: Self-pay | Admitting: Internal Medicine
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
## 2023-04-15 ENCOUNTER — Ambulatory Visit (INDEPENDENT_AMBULATORY_CARE_PROVIDER_SITE_OTHER): Payer: Self-pay | Admitting: Internal Medicine

## 2023-04-15 ENCOUNTER — Encounter: Payer: Self-pay | Admitting: Internal Medicine

## 2023-04-15 VITALS — BP 118/76 | HR 99 | Temp 98.0°F | Ht 62.0 in | Wt 150.7 lb

## 2023-04-15 DIAGNOSIS — Z01818 Encounter for other preprocedural examination: Secondary | ICD-10-CM

## 2023-04-15 DIAGNOSIS — M542 Cervicalgia: Secondary | ICD-10-CM

## 2023-04-15 DIAGNOSIS — M545 Low back pain, unspecified: Secondary | ICD-10-CM

## 2023-04-16 LAB — COMPLETE METABOLIC PANEL WITH GFR
AG Ratio: 1.6 (calc) (ref 1.0–2.5)
ALT: 16 U/L (ref 6–29)
AST: 14 U/L (ref 10–30)
Albumin: 4.2 g/dL (ref 3.6–5.1)
Alkaline phosphatase (APISO): 61 U/L (ref 31–125)
BUN: 13 mg/dL (ref 7–25)
CO2: 26 mmol/L (ref 20–32)
Calcium: 9.7 mg/dL (ref 8.6–10.2)
Chloride: 100 mmol/L (ref 98–110)
Creat: 0.74 mg/dL (ref 0.50–0.99)
Globulin: 2.7 g/dL (ref 1.9–3.7)
Glucose, Bld: 94 mg/dL (ref 65–99)
Potassium: 4.6 mmol/L (ref 3.5–5.3)
Sodium: 138 mmol/L (ref 135–146)
Total Bilirubin: 0.4 mg/dL (ref 0.2–1.2)
Total Protein: 6.9 g/dL (ref 6.1–8.1)
eGFR: 103 mL/min/{1.73_m2} (ref >=60)

## 2023-04-16 LAB — HEMOGLOBIN A1C
Hgb A1c MFr Bld: 5.4 %{Hb} (ref <5.7)
Mean Plasma Glucose: 108 mg/dL
eAG (mmol/L): 6 mmol/L

## 2023-04-16 LAB — PROTIME-INR
INR: 0.9
Prothrombin Time: 10.3 s (ref 9.0–11.5)

## 2023-04-16 LAB — CBC WITH DIFFERENTIAL/PLATELET
Absolute Monocytes: 730 {cells}/uL (ref 200–950)
Basophils Absolute: 19 {cells}/uL (ref 0–200)
Basophils Relative: 0.3 %
Eosinophils Absolute: 179 {cells}/uL (ref 15–500)
Eosinophils Relative: 2.8 %
HCT: 39 % (ref 35.0–45.0)
Hemoglobin: 13.2 g/dL (ref 11.7–15.5)
Lymphs Abs: 1146 {cells}/uL (ref 850–3900)
MCH: 32.6 pg (ref 27.0–33.0)
MCHC: 33.8 g/dL (ref 32.0–36.0)
MCV: 96.3 fL (ref 80.0–100.0)
MPV: 10.5 fL (ref 7.5–12.5)
Monocytes Relative: 11.4 %
Neutro Abs: 4326 {cells}/uL (ref 1500–7800)
Neutrophils Relative %: 67.6 %
Platelets: 253 10*3/uL (ref 140–400)
RBC: 4.05 10*6/uL (ref 3.80–5.10)
RDW: 12.4 % (ref 11.0–15.0)
Total Lymphocyte: 17.9 %
WBC: 6.4 10*3/uL (ref 3.8–10.8)

## 2023-04-17 ENCOUNTER — Telehealth: Payer: Self-pay | Admitting: Internal Medicine

## 2023-04-17 ENCOUNTER — Other Ambulatory Visit: Payer: Self-pay | Admitting: Nurse Practitioner

## 2023-04-17 DIAGNOSIS — M542 Cervicalgia: Secondary | ICD-10-CM

## 2023-04-17 DIAGNOSIS — M5440 Lumbago with sciatica, unspecified side: Secondary | ICD-10-CM

## 2023-04-17 NOTE — Telephone Encounter (Signed)
Records up front ready for pickup, pt notified

## 2023-04-17 NOTE — Telephone Encounter (Signed)
Patient called stated she need the clearance letter emailed to her so she can email it to her attorney asap. Please f/u with patient when done. Her email address is [email or web address]

## 2023-04-17 NOTE — Telephone Encounter (Signed)
Patient would like to pick up the lab and EKG results and form today.

## 2023-04-18 ENCOUNTER — Ambulatory Visit
Admission: RE | Admit: 2023-04-18 | Discharge: 2023-04-18 | Disposition: A | Discharge status: Home / Self Care | Payer: Self-pay | Source: Ambulatory Visit | Attending: Nurse Practitioner | Admitting: Nurse Practitioner

## 2023-04-18 DIAGNOSIS — M5440 Lumbago with sciatica, unspecified side: Secondary | ICD-10-CM

## 2023-04-18 DIAGNOSIS — M542 Cervicalgia: Secondary | ICD-10-CM

## 2023-04-19 ENCOUNTER — Other Ambulatory Visit: Payer: Self-pay | Admitting: Internal Medicine

## 2023-04-19 DIAGNOSIS — F419 Anxiety disorder, unspecified: Secondary | ICD-10-CM

## 2023-04-19 NOTE — Telephone Encounter (Signed)
Requested Prescriptions  Pending Prescriptions Disp Refills   buPROPion (WELLBUTRIN XL) 150 MG 24 hr tablet [Pharmacy Med Name: buPROPion HCL XL 150 MG TABLET] 90 tablet 0    Sig: TAKE 1 TABLET BY MOUTH DAILY     Psychiatry: Antidepressants - bupropion Passed - 04/19/2023  6:22 AM      Passed - Cr in normal range and within 360 days    Creat  Date Value Ref Range Status  04/15/2023 0.74 0.50 - 0.99 mg/dL Final         Passed - AST in normal range and within 360 days    AST  Date Value Ref Range Status  04/15/2023 14 10 - 30 U/L Final         Passed - ALT in normal range and within 360 days    ALT  Date Value Ref Range Status  04/15/2023 16 6 - 29 U/L Final         Passed - Last BP in normal range    BP Readings from Last 1 Encounters:  04/15/23 118/76         Passed - Valid encounter within last 6 months    Recent Outpatient Visits           4 days ago Pre-op evaluation   [facility] Margarita Mail, DO   5 months ago Hypertension, unspecified type   [facility] Margarita Mail, DO   12 months ago Hypertension, unspecified type   [facility] Berniece Salines, FNP   1 year ago Hypertension, unspecified type   [facility] Margarita Mail, DO   1 year ago History of stomach ulcers   [facility] Margarita Mail, [place]

## 2023-06-20 ENCOUNTER — Other Ambulatory Visit: Payer: Self-pay | Admitting: Internal Medicine

## 2023-06-20 DIAGNOSIS — F419 Anxiety disorder, unspecified: Secondary | ICD-10-CM

## 2023-06-21 ENCOUNTER — Telehealth: Payer: Self-pay | Admitting: Internal Medicine

## 2023-06-21 ENCOUNTER — Other Ambulatory Visit: Payer: Self-pay | Admitting: Internal Medicine

## 2023-06-21 DIAGNOSIS — I10 Essential (primary) hypertension: Secondary | ICD-10-CM

## 2023-06-21 NOTE — Telephone Encounter (Signed)
Requested medication (s) are due for refill today -yes/no  Requested medication (s) are on the active medication list -yes  Future visit scheduled -no  Last refill: escitalopram - 11/16/22 #90 1RF- has notes                 Bupropion 04/19/23 #90- too soon  Notes to clinic: resent pre-op - will that count as 6 month visit- or does patient need another appointment  Requested Prescriptions  Pending Prescriptions Disp Refills   escitalopram (LEXAPRO) 20 MG tablet [Pharmacy Med Name: ESCITALOPRAM 20 MG TABLET] 90 tablet 1    Sig: TAKE 1 TABLET BY MOUTH DAILY     Psychiatry:  Antidepressants - SSRI Passed - 06/20/2023  4:27 PM      Passed - Valid encounter within last 6 months    Recent Outpatient Visits           2 months ago Pre-op evaluation   [facility] Margarita Mail, DO   7 months ago Hypertension, unspecified type   [facility] Margarita Mail, DO   1 year ago Hypertension, unspecified type   [facility] Berniece Salines, FNP   1 year ago Hypertension, unspecified type   [facility] Margarita Mail, DO   1 year ago History of stomach ulcers   [facility] Margarita Mail, DO               buPROPion (WELLBUTRIN XL) 150 MG 24 hr tablet [Pharmacy Med Name: buPROPion HCL XL 150 MG TABLET] 90 tablet 0    Sig: TAKE 1 TABLET BY MOUTH DAILY     Psychiatry: Antidepressants - bupropion Passed - 06/20/2023  4:27 PM      Passed - Cr in normal range and within 360 days    Creat  Date Value Ref Range Status  04/15/2023 0.74 0.50 - 0.99 mg/dL Final         Passed - AST in normal range and within 360 days    AST  Date Value Ref Range Status  04/15/2023 14 10 - 30 U/L Final         Passed - ALT in normal range and within 360 days    ALT  Date Value Ref Range Status  04/15/2023 16 6 - 29 U/L Final         Passed - Last BP in  normal range    BP Readings from Last 1 Encounters:  04/15/23 118/76         Passed - Valid encounter within last 6 months    Recent Outpatient Visits           2 months ago Pre-op evaluation   [facility] Margarita Mail, DO   7 months ago Hypertension, unspecified type   [facility] Margarita Mail, DO   1 year ago Hypertension, unspecified type   [facility] Berniece Salines, FNP   1 year ago Hypertension, unspecified type   [facility] Margarita Mail, DO   1 year ago History of stomach ulcers   [facility] Margarita Mail, DO                 Requested Prescriptions  Pending Prescriptions Disp Refills   escitalopram (LEXAPRO) 20 MG tablet [Pharmacy Med Name: ESCITALOPRAM 20 MG TABLET] 90 tablet 1    Sig:  TAKE 1 TABLET BY MOUTH DAILY     Psychiatry:  Antidepressants - SSRI Passed - 06/20/2023  4:27 PM      Passed - Valid encounter within last 6 months    Recent Outpatient Visits           2 months ago Pre-op evaluation   [facility] Margarita Mail, DO   7 months ago Hypertension, unspecified type   [facility] Margarita Mail, DO   1 year ago Hypertension, unspecified type   [facility] Berniece Salines, FNP   1 year ago Hypertension, unspecified type   [facility] Margarita Mail, DO   1 year ago History of stomach ulcers   [facility] Margarita Mail, DO               buPROPion (WELLBUTRIN XL) 150 MG 24 hr tablet [Pharmacy Med Name: buPROPion HCL XL 150 MG TABLET] 90 tablet 0    Sig: TAKE 1 TABLET BY MOUTH DAILY     Psychiatry: Antidepressants - bupropion Passed - 06/20/2023  4:27 PM      Passed - Cr in normal range and within 360 days    Creat  Date Value Ref  Range Status  04/15/2023 0.74 0.50 - 0.99 mg/dL Final         Passed - AST in normal range and within 360 days    AST  Date Value Ref Range Status  04/15/2023 14 10 - 30 U/L Final         Passed - ALT in normal range and within 360 days    ALT  Date Value Ref Range Status  04/15/2023 16 6 - 29 U/L Final         Passed - Last BP in normal range    BP Readings from Last 1 Encounters:  04/15/23 118/76         Passed - Valid encounter within last 6 months    Recent Outpatient Visits           2 months ago Pre-op evaluation   [facility] Margarita Mail, DO   7 months ago Hypertension, unspecified type   [facility] Margarita Mail, DO   1 year ago Hypertension, unspecified type   [facility] Berniece Salines, FNP   1 year ago Hypertension, unspecified type   [facility] Margarita Mail, DO   1 year ago History of stomach ulcers   [facility] Margarita Mail, [place]

## 2023-06-21 NOTE — Telephone Encounter (Signed)
Requested Prescriptions  Pending Prescriptions Disp Refills   lisinopril-hydrochlorothiazide (ZESTORETIC) 20-12.5 MG tablet [Pharmacy Med Name: LISINOPRIL-HCTZ 20-12.5 MG TAB] 90 tablet 1    Sig: TAKE 1 TABLET BY MOUTH DAILY     Cardiovascular:  ACEI + Diuretic Combos Passed - 06/21/2023  6:22 AM      Passed - Na in normal range and within 180 days    Sodium  Date Value Ref Range Status  04/15/2023 138 135 - 146 mmol/L Final         Passed - K in normal range and within 180 days    Potassium  Date Value Ref Range Status  04/15/2023 4.6 3.5 - 5.3 mmol/L Final         Passed - Cr in normal range and within 180 days    Creat  Date Value Ref Range Status  04/15/2023 0.74 0.50 - 0.99 mg/dL Final         Passed - eGFR is 30 or above and within 180 days    GFR, Est African American  Date Value Ref Range Status  01/13/2019 97 > OR = 60 mL/min/1.11m2 Final   GFR, Est Non African American  Date Value Ref Range Status  01/13/2019 84 > OR = 60 mL/min/1.17m2 Final   GFR, Estimated  Date Value Ref Range Status  12/27/2022 >60 >60 mL/min Final    Comment:    (NOTE) Calculated using the CKD-EPI Creatinine Equation (2021)    eGFR  Date Value Ref Range Status  04/15/2023 103 > OR = 60 mL/min/1.50m2 Final         Passed - Patient is not pregnant      Passed - Last BP in normal range    BP Readings from Last 1 Encounters:  04/15/23 118/76         Passed - Valid encounter within last 6 months    Recent Outpatient Visits           2 months ago Pre-op evaluation   [facility] Margarita Mail, DO   7 months ago Hypertension, unspecified type   [facility] Margarita Mail, DO   1 year ago Hypertension, unspecified type   [facility] Berniece Salines, FNP   1 year ago Hypertension, unspecified type   [facility] Margarita Mail, DO   1 year ago History of stomach  ulcers   [facility] Margarita Mail, [place]

## 2023-06-21 NOTE — Telephone Encounter (Signed)
Pt called in asking if Dr Caralee Ates can go ahead and right her the rx for the individual nasal spray of Azelastine-Fluticasone (DYMISTA) , because the rx now with 2 together is too expensive, and she needs this right away because she is leaving town tomorrow.

## 2023-06-23 ENCOUNTER — Other Ambulatory Visit: Payer: Self-pay | Admitting: Internal Medicine

## 2023-06-23 DIAGNOSIS — J310 Chronic rhinitis: Secondary | ICD-10-CM

## 2023-06-23 MED ORDER — AZELASTINE HCL 0.1 % NA SOLN: 1.0000 | Freq: Two times a day (BID) | NASAL | 12 refills | Status: AC | PRN

## 2023-06-23 MED ORDER — FLUTICASONE PROPIONATE 50 MCG/ACT NA SUSP: 2.0000 | Freq: Every day | NASAL | 6 refills | Status: AC

## 2023-09-23 ENCOUNTER — Other Ambulatory Visit: Payer: Self-pay | Admitting: Internal Medicine

## 2023-09-23 DIAGNOSIS — F419 Anxiety disorder, unspecified: Secondary | ICD-10-CM

## 2023-09-24 NOTE — Telephone Encounter (Signed)
Requested Prescriptions  Pending Prescriptions Disp Refills   buPROPion (WELLBUTRIN XL) 150 MG 24 hr tablet [Pharmacy Med Name: buPROPion HCL XL 150 MG TABLET] 90 tablet 0    Sig: TAKE 1 TABLET BY MOUTH DAILY     Psychiatry: Antidepressants - bupropion Passed - 09/24/2023  9:11 AM      Passed - Cr in normal range and within 360 days    Creat  Date Value Ref Range Status  04/15/2023 0.74 0.50 - 0.99 mg/dL Final         Passed - AST in normal range and within 360 days    AST  Date Value Ref Range Status  04/15/2023 14 10 - 30 U/L Final         Passed - ALT in normal range and within 360 days    ALT  Date Value Ref Range Status  04/15/2023 16 6 - 29 U/L Final         Passed - Last BP in normal range    BP Readings from Last 1 Encounters:  04/15/23 118/76         Passed - Valid encounter within last 6 months    Recent Outpatient Visits           5 months ago Pre-op evaluation   [facility] Margarita Mail, DO   10 months ago Hypertension, unspecified type   [facility] Margarita Mail, DO   1 year ago Hypertension, unspecified type   [facility] Berniece Salines, FNP   1 year ago Hypertension, unspecified type   [facility] Margarita Mail, DO   1 year ago History of stomach ulcers   [facility] Margarita Mail, DO               escitalopram (LEXAPRO) 20 MG tablet [Pharmacy Med Name: ESCITALOPRAM 20 MG TABLET] 90 tablet 0    Sig: TAKE 1 TABLET BY MOUTH DAILY     Psychiatry:  Antidepressants - SSRI Passed - 09/24/2023  9:11 AM      Passed - Valid encounter within last 6 months    Recent Outpatient Visits           5 months ago Pre-op evaluation   [facility] Margarita Mail, DO   10 months ago Hypertension, unspecified type   [facility] Margarita Mail, DO    1 year ago Hypertension, unspecified type   [facility] Berniece Salines, FNP   1 year ago Hypertension, unspecified type   [facility] Margarita Mail, DO   1 year ago History of stomach ulcers   [facility] Margarita Mail, [place]

## 2024-01-01 ENCOUNTER — Other Ambulatory Visit: Payer: Self-pay | Admitting: Internal Medicine

## 2024-01-01 DIAGNOSIS — F419 Anxiety disorder, unspecified: Secondary | ICD-10-CM

## 2024-01-02 ENCOUNTER — Other Ambulatory Visit: Payer: Self-pay

## 2024-01-02 ENCOUNTER — Telehealth: Payer: Self-pay | Admitting: Internal Medicine

## 2024-01-02 DIAGNOSIS — F419 Anxiety disorder, unspecified: Secondary | ICD-10-CM

## 2024-01-02 MED ORDER — ESCITALOPRAM OXALATE 20 MG PO TABS: 20.0000 mg | ORAL_TABLET | Freq: Every day | ORAL | 0 refills | Status: DC

## 2024-01-02 NOTE — Telephone Encounter (Signed)
escitalopram (LEXAPRO) 20 MG tablet

## 2024-01-02 NOTE — Telephone Encounter (Signed)
 Called and made a med refill appointment. 30-day sent in until appointment.

## 2024-01-03 NOTE — Telephone Encounter (Signed)
 Requested Prescriptions  Pending Prescriptions Disp Refills   escitalopram  (LEXAPRO ) 20 MG tablet [Pharmacy Med Name: ESCITALOPRAM  20 MG TABLET] 90 tablet     Sig: TAKE 1 TABLET BY MOUTH DAILY     Psychiatry:  Antidepressants - SSRI Failed - 01/03/2024 11:20 AM      Failed - Valid encounter within last 6 months    Recent Outpatient Visits   None     Future Appointments             In 2 weeks Rockney Cid, DO [facility]             buPROPion  (WELLBUTRIN  XL) 150 MG 24 hr tablet [Pharmacy Med Name: buPROPion  HCL XL 150 MG TABLET] 90 tablet 0    Sig: TAKE 1 TABLET BY MOUTH DAILY     Psychiatry: Antidepressants - bupropion  Failed - 01/03/2024 11:20 AM      Failed - Valid encounter within last 6 months    Recent Outpatient Visits   None     Future Appointments             In 2 weeks Rockney Cid, DO  [facility]            Passed - Cr in normal range and within 360 days    Creat  Date Value Ref Range Status  04/15/2023 0.74 0.50 - 0.99 mg/dL Final         Passed - AST in normal range and within 360 days    AST  Date Value Ref Range Status  04/15/2023 14 10 - 30 U/L Final         Passed - ALT in normal range and within 360 days    ALT  Date Value Ref Range Status  04/15/2023 16 6 - 29 U/L Final         Passed - Last BP in normal range    BP Readings from Last 1 Encounters:  04/15/23 118/76

## 2024-01-20 ENCOUNTER — Ambulatory Visit: Payer: Self-pay | Admitting: Internal Medicine

## 2024-02-26 ENCOUNTER — Other Ambulatory Visit: Payer: Self-pay | Admitting: Internal Medicine

## 2024-02-26 DIAGNOSIS — F419 Anxiety disorder, unspecified: Secondary | ICD-10-CM

## 2024-02-26 NOTE — Telephone Encounter (Unsigned)
 Copied from CRM #[id number]. Topic: Clinical - Medication Refill >> Feb 26, 2024  2:57 PM Zebedee SAUNDERS wrote: Medication:  buPROPion  (WELLBUTRIN  XL) 150 MG 24 hr tablet and escitalopram  (LEXAPRO ) 20 MG tablet   Has the patient contacted their pharmacy? Yes (Agent: If no, request that the patient contact the pharmacy for the refill. If patient does not wish to contact the pharmacy document the reason why and proceed with request.) (Agent: If yes, when and what did the pharmacy advise?)  This is the patient's preferred pharmacy:  [id number] [place], [place] - [address] [place] [address] Phone: [phone] Fax: [phone]   Is this the correct pharmacy for this prescription? Yes If no, delete pharmacy and type the correct one.   Has the prescription been filled recently? Yes  Is the patient out of the medication? Yes  Has the patient been seen for an appointment in the last year OR does the patient have an upcoming appointment? Yes  Can we respond through MyChart? Yes  Agent: Please be advised that Rx refills may take up to 3 business days. We ask that you follow-up with your pharmacy.

## 2024-02-27 ENCOUNTER — Other Ambulatory Visit: Payer: Self-pay | Admitting: Internal Medicine

## 2024-02-27 ENCOUNTER — Telehealth: Payer: Self-pay

## 2024-02-27 DIAGNOSIS — F419 Anxiety disorder, unspecified: Secondary | ICD-10-CM

## 2024-02-27 NOTE — Telephone Encounter (Signed)
 Called patient left vm, per Mliss we need to reschedule to PCP dr. Bernardo.  She should have openings for monday

## 2024-02-28 MED ORDER — BUPROPION HCL ER (XL) 150 MG PO TB24: 150.0000 mg | ORAL_TABLET | Freq: Every day | ORAL | 0 refills | Status: AC

## 2024-02-28 MED ORDER — ESCITALOPRAM OXALATE 20 MG PO TABS: 20.0000 mg | ORAL_TABLET | Freq: Every day | ORAL | 0 refills | Status: AC

## 2024-02-28 NOTE — Telephone Encounter (Signed)
 Refused Lexapro  20 mg because this is a duplicate request.  Sent 02/28/2024 #30, 0 refills

## 2024-02-28 NOTE — Telephone Encounter (Signed)
 Requested Prescriptions  Pending Prescriptions Disp Refills   buPROPion  (WELLBUTRIN  XL) 150 MG 24 hr tablet 30 tablet 0    Sig: Take 1 tablet (150 mg total) by mouth daily.     Psychiatry: Antidepressants - bupropion  Failed - 02/28/2024 10:09 AM      Failed - Valid encounter within last 6 months    Recent Outpatient Visits   None            Passed - Cr in normal range and within 360 days    Creat  Date Value Ref Range Status  04/15/2023 0.74 0.50 - 0.99 mg/dL Final         Passed - AST in normal range and within 360 days    AST  Date Value Ref Range Status  04/15/2023 14 10 - 30 U/L Final         Passed - ALT in normal range and within 360 days    ALT  Date Value Ref Range Status  04/15/2023 16 6 - 29 U/L Final         Passed - Last BP in normal range    BP Readings from Last 1 Encounters:  04/15/23 118/76          escitalopram  (LEXAPRO ) 20 MG tablet 30 tablet 0    Sig: Take 1 tablet (20 mg total) by mouth daily.     Psychiatry:  Antidepressants - SSRI Failed - 02/28/2024 10:09 AM      Failed - Valid encounter within last 6 months    Recent Outpatient Visits   None

## 2024-03-03 ENCOUNTER — Ambulatory Visit: Payer: Self-pay | Admitting: Nurse Practitioner

## 2024-03-27 ENCOUNTER — Other Ambulatory Visit: Payer: Self-pay | Admitting: Internal Medicine

## 2024-03-27 DIAGNOSIS — F419 Anxiety disorder, unspecified: Secondary | ICD-10-CM

## 2024-03-27 NOTE — Telephone Encounter (Signed)
 Second request

## 2024-03-27 NOTE — Telephone Encounter (Signed)
 Requested medications are due for refill today.  yes  Requested medications are on the active medications list.  yes  Last refill. 02/28/2024 #30 0 rf  Future visit scheduled.   no  Notes to clinic.  Pt has missed 2 scheduled appts. Pt already given a courtesy refill. Last seen 04/15/2023.    Requested Prescriptions  Pending Prescriptions Disp Refills   buPROPion  (WELLBUTRIN  XL) 150 MG 24 hr tablet [Pharmacy Med Name: buPROPion  HCL XL 150 MG TABLET] 30 tablet 0    Sig: TAKE 1 TABLET BY MOUTH DAILY     Psychiatry: Antidepressants - bupropion  Failed - 03/27/2024  4:53 PM      Failed - Valid encounter within last 6 months    Recent Outpatient Visits   None            Passed - Cr in normal range and within 360 days    Creat  Date Value Ref Range Status  04/15/2023 0.74 0.50 - 0.99 mg/dL Final         Passed - AST in normal range and within 360 days    AST  Date Value Ref Range Status  04/15/2023 14 10 - 30 U/L Final         Passed - ALT in normal range and within 360 days    ALT  Date Value Ref Range Status  04/15/2023 16 6 - 29 U/L Final         Passed - Last BP in normal range    BP Readings from Last 1 Encounters:  04/15/23 118/76          escitalopram  (LEXAPRO ) 20 MG tablet [Pharmacy Med Name: ESCITALOPRAM  20 MG TABLET] 30 tablet 0    Sig: TAKE 1 TABLET BY MOUTH DAILY     Psychiatry:  Antidepressants - SSRI Failed - 03/27/2024  4:53 PM      Failed - Valid encounter within last 6 months    Recent Outpatient Visits   None

## 2024-04-03 ENCOUNTER — Ambulatory Visit: Payer: Self-pay

## 2024-04-03 NOTE — Telephone Encounter (Signed)
 Patient requesting refill. Advised of message regarding missed appointments. Scheduled for next available OV on 04/07/24. Pt requesting bridge for 4 days.  Patient refused recommendation to go to UC.  Reason for Disposition  [1] Prescription refill request for NON-ESSENTIAL medicine (i.e., no harm to patient if med not taken) AND [2] triager unable to refill per department policy  Answer Assessment - Initial Assessment Questions 1. DRUG NAME: What medicine do you need to have refilled?     Wellbutrin  150 mg and Lexapro  20 mg  2. REFILLS REMAINING: How many refills are remaining? Notes: The label on the medicine or pill bottle will show how many refills are remaining. If there are no refills remaining, then a renewal may be needed.      3. [id number] - [place], [place] - [address] [[id number]]  Protocols used: Medication Refill and Renewal Call-A-AH

## 2024-04-07 ENCOUNTER — Ambulatory Visit: Payer: Self-pay | Admitting: Family Medicine

## 2024-04-07 NOTE — Telephone Encounter (Signed)
 Attempted to call patient to reschedule with PCP Dr. Bernardo.  Need to be seen by PCP who has appointments tomorrow.  Cancelling her appointment with Dr. Avelina today.

## 2024-04-08 ENCOUNTER — Other Ambulatory Visit: Payer: Self-pay | Admitting: Internal Medicine

## 2024-04-08 ENCOUNTER — Ambulatory Visit: Payer: Self-pay | Admitting: Internal Medicine

## 2024-04-27 ENCOUNTER — Other Ambulatory Visit: Payer: Self-pay | Admitting: Internal Medicine

## 2024-04-27 DIAGNOSIS — I10 Essential (primary) hypertension: Secondary | ICD-10-CM
